# Patient Record
Sex: Female | Born: 1945 | Race: White | Hispanic: No | Marital: Married | Smoking: Never smoker
Health system: Southern US, Community
[De-identification: ages and names within clinical notes are randomized; demographics above are authoritative.]

## PROBLEM LIST (undated history)

## (undated) DIAGNOSIS — W19XXXA Unspecified fall, initial encounter: Secondary | ICD-10-CM

## (undated) DIAGNOSIS — I5189 Other ill-defined heart diseases: Secondary | ICD-10-CM

## (undated) DIAGNOSIS — I10 Essential (primary) hypertension: Secondary | ICD-10-CM

## (undated) DIAGNOSIS — R55 Syncope and collapse: Secondary | ICD-10-CM

## (undated) DIAGNOSIS — R7302 Impaired glucose tolerance (oral): Secondary | ICD-10-CM

## (undated) HISTORY — PX: CHOLECYSTECTOMY: SHX55

## (undated) HISTORY — DX: Impaired glucose tolerance (oral): R73.02

## (undated) HISTORY — DX: Unspecified fall, initial encounter: W19.XXXA

## (undated) HISTORY — DX: Other ill-defined heart diseases: I51.89

## (undated) HISTORY — PX: TOTAL ABDOMINAL HYSTERECTOMY: SHX209

## (undated) HISTORY — DX: Essential (primary) hypertension: I10

## (undated) HISTORY — PX: APPENDECTOMY: SHX54

## (undated) HISTORY — DX: Syncope and collapse: R55

## (undated) HISTORY — DX: Morbid (severe) obesity due to excess calories: E66.01

---

## 2001-03-05 HISTORY — PX: BREAST EXCISIONAL BIOPSY: SUR124

## 2010-02-01 ENCOUNTER — Encounter (INDEPENDENT_AMBULATORY_CARE_PROVIDER_SITE_OTHER): Payer: Self-pay | Admitting: Internal Medicine

## 2010-02-01 ENCOUNTER — Observation Stay (HOSPITAL_COMMUNITY)
Admission: EM | Admit: 2010-02-01 | Discharge: 2010-02-03 | Payer: Self-pay | Source: Home / Self Care | Admitting: Emergency Medicine

## 2010-02-01 ENCOUNTER — Ambulatory Visit: Payer: Self-pay | Admitting: Cardiology

## 2010-02-02 ENCOUNTER — Encounter (INDEPENDENT_AMBULATORY_CARE_PROVIDER_SITE_OTHER): Payer: Self-pay | Admitting: Internal Medicine

## 2010-02-13 ENCOUNTER — Ambulatory Visit: Payer: Self-pay | Admitting: Cardiology

## 2010-02-28 ENCOUNTER — Telehealth (INDEPENDENT_AMBULATORY_CARE_PROVIDER_SITE_OTHER): Payer: Self-pay | Admitting: *Deleted

## 2010-03-01 ENCOUNTER — Encounter (HOSPITAL_COMMUNITY)
Admission: RE | Admit: 2010-03-01 | Discharge: 2010-04-04 | Payer: Self-pay | Source: Home / Self Care | Attending: Cardiology | Admitting: Cardiology

## 2010-03-01 ENCOUNTER — Encounter: Payer: Self-pay | Admitting: Cardiology

## 2010-03-01 ENCOUNTER — Ambulatory Visit: Payer: Self-pay

## 2010-03-31 ENCOUNTER — Ambulatory Visit: Payer: Self-pay | Admitting: Cardiology

## 2010-04-06 NOTE — Assessment & Plan Note (Signed)
Summary: Cardiology Nuclear Testing  Nuclear Med Background Indications for Stress Test: Evaluation for Ischemia, Post Hospital  Indications Comments: 02/01/10 [facility] for syncope and neg enzymes  History: Echo  History Comments: 02/02/10 Echo EF-NL  Symptoms: DOE, Syncope    Nuclear Pre-Procedure Cardiac Risk Factors: Family History - CAD, Hypertension, NIDDM Caffeine/Decaff Intake: none NPO After: 7:00 PM Lungs: Clear IV 0.9% NS with Angio Cath: 20g     IV Site: R Hand IV Started by: Cathlyn Parsons, RN Chest Size (in) 38     Cup Size B     Height (in): 67 Weight (lb): 182 BMI: 28.61  Nuclear Med Study 1 or 2 day study:  1 day     Stress Test Type:  Treadmill/Lexiscan Reading MD:  Olga Millers, MD     Referring MD:  Roger Shelter Resting Radionuclide:  Technetium 27m Tetrofosmin     Resting Radionuclide Dose:  11.0 mCi  Stress Radionuclide:  Technetium 42m Tetrofosmin     Stress Radionuclide Dose:  33.0 mCi   Stress Protocol      Max HR:  116 bpm     Predicted Max HR:  154 bpm  Max Systolic BP: 163 mm Hg     Percent Max HR:  75.32 %Rate Pressure Product:  09811  Lexiscan: 0.4 mg   Stress Test Technologist:  Irean Hong,  RN     Nuclear Technologist:  Harlow Asa, CNMT  Rest Procedure  Myocardial perfusion imaging was performed at rest 45 minutes following the intravenous administration of Technetium 63m Tetrofosmin.  Stress Procedure  The patient received IV Lexiscan 0.4 mg over 15-seconds with concurrent low level exercise and then Technetium 56m Tetrofosmin was injected at 30-seconds while the patient continued walking one more minute.  There were T wave changes with lexiscan. Quantitative spect images were obtained after a 45 minute delay.  QPS Raw Data Images:  Acquisition technically good; normal left ventricular size. Stress Images:  There is decreased uptake in the anterior wall. Rest Images:  There is decreased uptake in the anterior  wall. Subtraction (SDS):  No evidence of ischemia. Transient Ischemic Dilatation:  1.06  (Normal <1.22)  Lung/Heart Ratio:  .27  (Normal <0.45)  Quantitative Gated Spect Images QGS EDV:  81 ml QGS ESV:  24 ml QGS EF:  70 % QGS cine images:  Normal wall motion.   Overall Impression  Exercise Capacity: Lexiscan with no exercise. BP Response: Normal blood pressure response. Clinical Symptoms: There is chest pain ECG Impression: No significant ST segment change suggestive of ischemia. Overall Impression: Normal lexiscan nuclear study with anterior soft tissue attenuation but no ischemia.  Appended Document: Cardiology Nuclear Testing COPY SENT TO DR. Deborah Chalk

## 2010-04-06 NOTE — Progress Notes (Signed)
Summary: nuc pre procedure  Phone Note Outgoing Call Call back at Home Phone [phone]   Call placed by: Cathlyn Parsons RN,  February 28, 2010 5:21 PM Call placed to: Patient Reason for Call: Confirm/change Appt Summary of Call: Reviewed information on [employer] Information Sheet (see scanned document for further details).  Spoke with patient.      Nuclear Med Background Indications for Stress Test: Evaluation for Ischemia  Indications Comments: 02/01/10 [facility] for syncope and neg enzymes  History: Echo  History Comments: 02/02/10 Echo EF-nl  Symptoms: Syncope    Nuclear Pre-Procedure Cardiac Risk Factors: Family History - CAD, Hypertension, NIDDM

## 2010-05-16 LAB — CBC
HCT: 36 % (ref 36.0–46.0)
Platelets: 178 10*3/uL (ref 150–400)
RDW: 14.3 % (ref 11.5–15.5)
WBC: 4.9 10*3/uL (ref 4.0–10.5)

## 2010-05-16 LAB — CARDIAC PANEL(CRET KIN+CKTOT+MB+TROPI)
CK, MB: 1.1 ng/mL (ref 0.3–4.0)
Relative Index: INVALID (ref 0.0–2.5)
Troponin I: <0.01 ng/mL (ref 0.00–0.06)

## 2010-05-16 LAB — GLUCOSE, CAPILLARY

## 2010-05-16 LAB — BASIC METABOLIC PANEL
BUN: 10 mg/dL (ref 6–23)
Chloride: 111 mEq/L (ref 96–112)
Potassium: 4.1 mEq/L (ref 3.5–5.1)

## 2010-05-17 LAB — POCT I-STAT, CHEM 8
BUN: 17 mg/dL (ref 6–23)
Calcium, Ion: 1.22 mmol/L (ref 1.12–1.32)
Chloride: 109 mEq/L (ref 96–112)
Glucose, Bld: 114 mg/dL — ABNORMAL HIGH (ref 70–99)

## 2010-05-17 LAB — CBC
HCT: 39.5 % (ref 36.0–46.0)
Hemoglobin: 13.5 g/dL (ref 12.0–15.0)
RBC: 4.61 MIL/uL (ref 3.87–5.11)
WBC: 4.2 10*3/uL (ref 4.0–10.5)

## 2010-05-17 LAB — DIFFERENTIAL
Lymphocytes Relative: 29 % (ref 12–46)
Monocytes Absolute: 0.4 10*3/uL (ref 0.1–1.0)
Monocytes Relative: 9 % (ref 3–12)
Neutro Abs: 2.4 10*3/uL (ref 1.7–7.7)

## 2010-05-17 LAB — HEMOGLOBIN A1C: Mean Plasma Glucose: 131 mg/dL — ABNORMAL HIGH (ref <117)

## 2010-05-17 LAB — TSH: TSH: 1.466 u[IU]/mL (ref 0.350–4.500)

## 2010-05-17 LAB — URINALYSIS, ROUTINE W REFLEX MICROSCOPIC
Glucose, UA: NEGATIVE mg/dL
Ketones, ur: NEGATIVE mg/dL
Specific Gravity, Urine: 1.019 (ref 1.005–1.030)
pH: 7 (ref 5.0–8.0)

## 2010-05-17 LAB — POCT CARDIAC MARKERS
CKMB, poc: <1 ng/mL — ABNORMAL LOW (ref 1.0–8.0)
Myoglobin, poc: 67.9 ng/mL (ref 12–200)
Troponin i, poc: <0.05 ng/mL (ref 0.00–0.09)

## 2010-05-17 LAB — CARDIAC PANEL(CRET KIN+CKTOT+MB+TROPI)
Relative Index: INVALID (ref 0.0–2.5)
Total CK: 76 U/L (ref 7–177)
Troponin I: <0.01 ng/mL (ref 0.00–0.06)

## 2010-05-17 LAB — CK TOTAL AND CKMB (NOT AT ARMC): CK, MB: 1.4 ng/mL (ref 0.3–4.0)

## 2010-11-20 ENCOUNTER — Other Ambulatory Visit: Payer: Self-pay | Admitting: Cardiology

## 2010-11-20 NOTE — Telephone Encounter (Signed)
Refilled amlodipine 

## 2011-02-14 ENCOUNTER — Other Ambulatory Visit: Payer: Self-pay | Admitting: Cardiology

## 2011-03-21 DIAGNOSIS — H35369 Drusen (degenerative) of macula, unspecified eye: Secondary | ICD-10-CM | POA: Diagnosis not present

## 2011-03-21 DIAGNOSIS — H3581 Retinal edema: Secondary | ICD-10-CM | POA: Diagnosis not present

## 2011-05-11 DIAGNOSIS — I10 Essential (primary) hypertension: Secondary | ICD-10-CM | POA: Diagnosis not present

## 2011-05-11 DIAGNOSIS — Z01818 Encounter for other preprocedural examination: Secondary | ICD-10-CM | POA: Diagnosis not present

## 2011-05-23 DIAGNOSIS — H3581 Retinal edema: Secondary | ICD-10-CM | POA: Diagnosis not present

## 2011-05-23 DIAGNOSIS — H35379 Puckering of macula, unspecified eye: Secondary | ICD-10-CM | POA: Diagnosis not present

## 2011-05-23 DIAGNOSIS — I10 Essential (primary) hypertension: Secondary | ICD-10-CM | POA: Diagnosis not present

## 2011-06-20 DIAGNOSIS — K219 Gastro-esophageal reflux disease without esophagitis: Secondary | ICD-10-CM | POA: Diagnosis not present

## 2011-06-20 DIAGNOSIS — I10 Essential (primary) hypertension: Secondary | ICD-10-CM | POA: Diagnosis not present

## 2011-06-22 DIAGNOSIS — Z131 Encounter for screening for diabetes mellitus: Secondary | ICD-10-CM | POA: Diagnosis not present

## 2011-06-22 DIAGNOSIS — I10 Essential (primary) hypertension: Secondary | ICD-10-CM | POA: Diagnosis not present

## 2011-06-22 DIAGNOSIS — K219 Gastro-esophageal reflux disease without esophagitis: Secondary | ICD-10-CM | POA: Diagnosis not present

## 2011-06-29 DIAGNOSIS — H35379 Puckering of macula, unspecified eye: Secondary | ICD-10-CM | POA: Diagnosis not present

## 2011-07-12 DIAGNOSIS — H35379 Puckering of macula, unspecified eye: Secondary | ICD-10-CM | POA: Diagnosis not present

## 2011-08-13 ENCOUNTER — Other Ambulatory Visit: Payer: Self-pay | Admitting: *Deleted

## 2011-08-13 MED ORDER — AMLODIPINE BESYLATE 5 MG PO TABS: 5.0000 mg | ORAL_TABLET | Freq: Every day | ORAL | Status: AC

## 2011-08-22 DIAGNOSIS — H3581 Retinal edema: Secondary | ICD-10-CM | POA: Diagnosis not present

## 2011-08-22 DIAGNOSIS — H35379 Puckering of macula, unspecified eye: Secondary | ICD-10-CM | POA: Diagnosis not present

## 2011-08-31 DIAGNOSIS — H3581 Retinal edema: Secondary | ICD-10-CM | POA: Diagnosis not present

## 2011-08-31 DIAGNOSIS — H35379 Puckering of macula, unspecified eye: Secondary | ICD-10-CM | POA: Diagnosis not present

## 2011-09-20 DIAGNOSIS — H3581 Retinal edema: Secondary | ICD-10-CM | POA: Diagnosis not present

## 2011-09-20 DIAGNOSIS — H35379 Puckering of macula, unspecified eye: Secondary | ICD-10-CM | POA: Diagnosis not present

## 2011-12-03 DIAGNOSIS — Z23 Encounter for immunization: Secondary | ICD-10-CM | POA: Diagnosis not present

## 2011-12-13 DIAGNOSIS — H35379 Puckering of macula, unspecified eye: Secondary | ICD-10-CM | POA: Diagnosis not present

## 2011-12-20 DIAGNOSIS — H35379 Puckering of macula, unspecified eye: Secondary | ICD-10-CM | POA: Diagnosis not present

## 2011-12-20 DIAGNOSIS — H3581 Retinal edema: Secondary | ICD-10-CM | POA: Diagnosis not present

## 2012-02-21 ENCOUNTER — Other Ambulatory Visit: Payer: Self-pay | Admitting: Family Medicine

## 2012-02-21 DIAGNOSIS — Z1231 Encounter for screening mammogram for malignant neoplasm of breast: Secondary | ICD-10-CM

## 2012-02-21 DIAGNOSIS — Z0000 Encounter for general adult medical examination without abnormal findings: Secondary | ICD-10-CM | POA: Diagnosis not present

## 2012-02-21 DIAGNOSIS — Z780 Asymptomatic menopausal state: Secondary | ICD-10-CM

## 2012-02-29 DIAGNOSIS — Z23 Encounter for immunization: Secondary | ICD-10-CM | POA: Diagnosis not present

## 2012-03-27 ENCOUNTER — Ambulatory Visit
Admission: RE | Admit: 2012-03-27 | Discharge: 2012-03-27 | Disposition: A | Discharge status: Home / Self Care | Payer: Medicare Other | Source: Ambulatory Visit | Attending: Family Medicine | Admitting: Family Medicine

## 2012-03-27 DIAGNOSIS — Z13820 Encounter for screening for osteoporosis: Secondary | ICD-10-CM | POA: Diagnosis not present

## 2012-03-27 DIAGNOSIS — Z1231 Encounter for screening mammogram for malignant neoplasm of breast: Secondary | ICD-10-CM | POA: Diagnosis not present

## 2012-03-27 DIAGNOSIS — Z780 Asymptomatic menopausal state: Secondary | ICD-10-CM

## 2012-04-09 DIAGNOSIS — D235 Other benign neoplasm of skin of trunk: Secondary | ICD-10-CM | POA: Diagnosis not present

## 2012-04-09 DIAGNOSIS — B009 Herpesviral infection, unspecified: Secondary | ICD-10-CM | POA: Diagnosis not present

## 2012-04-09 DIAGNOSIS — L820 Inflamed seborrheic keratosis: Secondary | ICD-10-CM | POA: Diagnosis not present

## 2012-04-30 ENCOUNTER — Encounter: Payer: Self-pay | Admitting: Cardiology

## 2012-06-23 DIAGNOSIS — K219 Gastro-esophageal reflux disease without esophagitis: Secondary | ICD-10-CM | POA: Diagnosis not present

## 2012-06-23 DIAGNOSIS — I10 Essential (primary) hypertension: Secondary | ICD-10-CM | POA: Diagnosis not present

## 2012-06-23 DIAGNOSIS — K5900 Constipation, unspecified: Secondary | ICD-10-CM | POA: Diagnosis not present

## 2012-07-21 DIAGNOSIS — H35379 Puckering of macula, unspecified eye: Secondary | ICD-10-CM | POA: Diagnosis not present

## 2012-07-23 DIAGNOSIS — H35379 Puckering of macula, unspecified eye: Secondary | ICD-10-CM | POA: Diagnosis not present

## 2012-07-23 DIAGNOSIS — H3581 Retinal edema: Secondary | ICD-10-CM | POA: Diagnosis not present

## 2012-08-20 DIAGNOSIS — H02829 Cysts of unspecified eye, unspecified eyelid: Secondary | ICD-10-CM | POA: Diagnosis not present

## 2012-09-24 DIAGNOSIS — H02829 Cysts of unspecified eye, unspecified eyelid: Secondary | ICD-10-CM | POA: Diagnosis not present

## 2013-01-05 DIAGNOSIS — Z23 Encounter for immunization: Secondary | ICD-10-CM | POA: Diagnosis not present

## 2013-03-09 ENCOUNTER — Other Ambulatory Visit: Payer: Self-pay

## 2013-03-09 DIAGNOSIS — Z1231 Encounter for screening mammogram for malignant neoplasm of breast: Secondary | ICD-10-CM

## 2013-03-11 DIAGNOSIS — Z0000 Encounter for general adult medical examination without abnormal findings: Secondary | ICD-10-CM | POA: Diagnosis not present

## 2013-03-11 DIAGNOSIS — Z136 Encounter for screening for cardiovascular disorders: Secondary | ICD-10-CM | POA: Diagnosis not present

## 2013-03-11 DIAGNOSIS — Z131 Encounter for screening for diabetes mellitus: Secondary | ICD-10-CM | POA: Diagnosis not present

## 2013-03-11 DIAGNOSIS — R7309 Other abnormal glucose: Secondary | ICD-10-CM | POA: Diagnosis not present

## 2013-03-30 ENCOUNTER — Ambulatory Visit
Admission: RE | Admit: 2013-03-30 | Discharge: 2013-03-30 | Disposition: A | Discharge status: Home / Self Care | Payer: Medicare Other | Source: Ambulatory Visit

## 2013-03-30 DIAGNOSIS — Z1231 Encounter for screening mammogram for malignant neoplasm of breast: Secondary | ICD-10-CM | POA: Diagnosis not present

## 2013-07-28 DIAGNOSIS — H40059 Ocular hypertension, unspecified eye: Secondary | ICD-10-CM | POA: Diagnosis not present

## 2013-07-28 DIAGNOSIS — H26499 Other secondary cataract, unspecified eye: Secondary | ICD-10-CM | POA: Diagnosis not present

## 2013-07-29 DIAGNOSIS — H3581 Retinal edema: Secondary | ICD-10-CM | POA: Diagnosis not present

## 2013-07-29 DIAGNOSIS — H35349 Macular cyst, hole, or pseudohole, unspecified eye: Secondary | ICD-10-CM | POA: Diagnosis not present

## 2013-10-01 DIAGNOSIS — H35369 Drusen (degenerative) of macula, unspecified eye: Secondary | ICD-10-CM | POA: Diagnosis not present

## 2013-10-01 DIAGNOSIS — H353190 Nonexudative age-related macular degeneration, unspecified eye, stage unspecified: Secondary | ICD-10-CM | POA: Diagnosis not present

## 2013-10-01 DIAGNOSIS — H3581 Retinal edema: Secondary | ICD-10-CM | POA: Diagnosis not present

## 2013-10-01 DIAGNOSIS — H35379 Puckering of macula, unspecified eye: Secondary | ICD-10-CM | POA: Diagnosis not present

## 2013-10-01 DIAGNOSIS — H35349 Macular cyst, hole, or pseudohole, unspecified eye: Secondary | ICD-10-CM | POA: Diagnosis not present

## 2013-11-25 DIAGNOSIS — Z23 Encounter for immunization: Secondary | ICD-10-CM | POA: Diagnosis not present

## 2013-12-17 DIAGNOSIS — H35341 Macular cyst, hole, or pseudohole, right eye: Secondary | ICD-10-CM | POA: Diagnosis not present

## 2013-12-17 DIAGNOSIS — H35372 Puckering of macula, left eye: Secondary | ICD-10-CM | POA: Diagnosis not present

## 2013-12-17 DIAGNOSIS — H3531 Nonexudative age-related macular degeneration: Secondary | ICD-10-CM | POA: Diagnosis not present

## 2014-01-04 ENCOUNTER — Ambulatory Visit (INDEPENDENT_AMBULATORY_CARE_PROVIDER_SITE_OTHER): Payer: Medicare Other | Admitting: Podiatry

## 2014-01-04 ENCOUNTER — Encounter: Payer: Self-pay | Admitting: Podiatry

## 2014-01-04 VITALS — BP 150/73 | HR 80 | Resp 13 | Ht 66.0 in | Wt 190.0 lb

## 2014-01-04 DIAGNOSIS — M79673 Pain in unspecified foot: Secondary | ICD-10-CM

## 2014-01-04 DIAGNOSIS — B070 Plantar wart: Secondary | ICD-10-CM

## 2014-01-04 NOTE — Progress Notes (Signed)
   Subjective:    Patient ID: Sarah Frye, female    DOB: Jun 20, 1945, 68 y.o.   MRN: [id number]  HPI Comments: N callouses vs plantar warts L B/L medial heels D right - 3 weeks     Left - couple months C hard skin with darkened center A walking or pressure T filing areas down  These recently developed skin lesions on the medial borders of the right and left heels are extremely painful and reduces the amount of standing walking. There is no prior history of these lesions.  Review of Systems  HENT: Positive for tinnitus.   Eyes: Positive for visual disturbance.  All other systems reviewed and are negative.      Objective:   Physical Exam  Orientated 3  Vascular: DP pulses 2/4 bilaterally PT pulses 2/4 bilaterally Capillary reflex immediate bilaterally  Neurological: Sensation to 10 g monofilament wire 5/5 bilaterally Ankle reflex equal and reactive bilaterally  Dermatological: The medial right and left heels have 5 mm circumscribed hemorrhagic keratoses that are exquisitely tender to pressure. These 2 lesions duplicated areas of patient's discomfort. Debridement of these lesions reveals pinpoint bleeding.  Musculoskeletal: No deformities noted      Assessment & Plan:   Assessment: Satisfactory neurovascular status bilaterally  verrucae plantaris 2 :(1 lesion right heel and 1 lesion left heel)  Plan: Advised patient that these lesions seem to be most consistent with plantar warts and discuss treatment options including self treatment of topical salicylic acid, office chemotherapy or curettage. Patient is opting for surgical curettage.  The right and left heels were blocked with 60 mg of Xylocaine with epinephrine 1-100,000 in divided doses. Skin is prepped with Betadine  The right and left skin lesions were sharply circumscribed and curetted. The bases were painted with phenol and antibiotic compression dressings applied.  Patient tolerated procedures without  any difficulty This lesions were submitted for biopsy in separate bottles labeled right and labeled left Notify patient of pathology reports  Postoperative oral written instructions provided Reappoint at patient's request

## 2014-01-04 NOTE — Patient Instructions (Signed)
Use over-the-counter pain medication for pain control  ANTIBACTERIAL SOAP INSTRUCTIONS  THE DAY AFTER PROCEDURE  Please follow the instructions your doctor has marked.   Shower as usual. Before getting out, place a drop of antibacterial liquid soap (Dial) on a wet, clean washcloth.  Gently wipe washcloth over affected area.  Afterward, rinse the area with warm water.  Blot the area dry with a soft cloth and cover with antibiotic ointment (neosporin, polysporin, bacitracin) and band aid or gauze and tape  Place 3-4 drops of antibacterial liquid soap in a quart of warm tap water.  Submerge foot into water for 20 minutes.  If bandage was applied after your procedure, leave on to allow for easy lift off, then remove and continue with soak for the remaining time.  Next, blot area dry with a soft cloth and cover with a bandage.  Apply other medications as directed by your doctor, such as cortisporin otic solution (eardrops) or neosporin antibiotic ointment

## 2014-01-05 ENCOUNTER — Telehealth: Payer: Self-pay | Admitting: *Deleted

## 2014-01-05 NOTE — Telephone Encounter (Signed)
Skin wedge B/L foot sent to [facility] to rule out Verruca via [employer].

## 2014-03-03 ENCOUNTER — Other Ambulatory Visit: Payer: Self-pay

## 2014-03-03 DIAGNOSIS — Z1231 Encounter for screening mammogram for malignant neoplasm of breast: Secondary | ICD-10-CM

## 2014-03-12 DIAGNOSIS — I10 Essential (primary) hypertension: Secondary | ICD-10-CM | POA: Diagnosis not present

## 2014-03-12 DIAGNOSIS — Z0000 Encounter for general adult medical examination without abnormal findings: Secondary | ICD-10-CM | POA: Diagnosis not present

## 2014-03-12 DIAGNOSIS — K219 Gastro-esophageal reflux disease without esophagitis: Secondary | ICD-10-CM | POA: Diagnosis not present

## 2014-03-12 DIAGNOSIS — R7309 Other abnormal glucose: Secondary | ICD-10-CM | POA: Diagnosis not present

## 2014-03-12 DIAGNOSIS — Z23 Encounter for immunization: Secondary | ICD-10-CM | POA: Diagnosis not present

## 2014-04-01 ENCOUNTER — Ambulatory Visit
Admission: RE | Admit: 2014-04-01 | Discharge: 2014-04-01 | Disposition: A | Discharge status: Home / Self Care | Payer: Medicare Other | Source: Ambulatory Visit

## 2014-04-01 DIAGNOSIS — Z1231 Encounter for screening mammogram for malignant neoplasm of breast: Secondary | ICD-10-CM

## 2014-04-05 DIAGNOSIS — Z86010 Personal history of colonic polyps: Secondary | ICD-10-CM | POA: Diagnosis not present

## 2014-04-05 DIAGNOSIS — K219 Gastro-esophageal reflux disease without esophagitis: Secondary | ICD-10-CM | POA: Diagnosis not present

## 2014-04-13 DIAGNOSIS — D485 Neoplasm of uncertain behavior of skin: Secondary | ICD-10-CM | POA: Diagnosis not present

## 2014-04-13 DIAGNOSIS — Z1283 Encounter for screening for malignant neoplasm of skin: Secondary | ICD-10-CM | POA: Diagnosis not present

## 2014-04-13 DIAGNOSIS — D225 Melanocytic nevi of trunk: Secondary | ICD-10-CM | POA: Diagnosis not present

## 2014-05-03 DIAGNOSIS — K2950 Unspecified chronic gastritis without bleeding: Secondary | ICD-10-CM | POA: Diagnosis not present

## 2014-05-03 DIAGNOSIS — K5730 Diverticulosis of large intestine without perforation or abscess without bleeding: Secondary | ICD-10-CM | POA: Diagnosis not present

## 2014-05-03 DIAGNOSIS — K449 Diaphragmatic hernia without obstruction or gangrene: Secondary | ICD-10-CM | POA: Diagnosis not present

## 2014-05-03 DIAGNOSIS — K219 Gastro-esophageal reflux disease without esophagitis: Secondary | ICD-10-CM | POA: Diagnosis not present

## 2014-05-03 DIAGNOSIS — Z09 Encounter for follow-up examination after completed treatment for conditions other than malignant neoplasm: Secondary | ICD-10-CM | POA: Diagnosis not present

## 2014-05-03 DIAGNOSIS — Z1211 Encounter for screening for malignant neoplasm of colon: Secondary | ICD-10-CM | POA: Diagnosis not present

## 2014-05-03 DIAGNOSIS — K317 Polyp of stomach and duodenum: Secondary | ICD-10-CM | POA: Diagnosis not present

## 2014-05-03 DIAGNOSIS — Z86010 Personal history of colonic polyps: Secondary | ICD-10-CM | POA: Diagnosis not present

## 2014-05-03 DIAGNOSIS — Z13810 Encounter for screening for upper gastrointestinal disorder: Secondary | ICD-10-CM | POA: Diagnosis not present

## 2014-05-10 DIAGNOSIS — H35341 Macular cyst, hole, or pseudohole, right eye: Secondary | ICD-10-CM | POA: Diagnosis not present

## 2014-06-02 DIAGNOSIS — M542 Cervicalgia: Secondary | ICD-10-CM | POA: Diagnosis not present

## 2014-07-28 DIAGNOSIS — E119 Type 2 diabetes mellitus without complications: Secondary | ICD-10-CM | POA: Diagnosis not present

## 2014-07-28 DIAGNOSIS — Z86010 Personal history of colonic polyps: Secondary | ICD-10-CM | POA: Diagnosis not present

## 2014-07-28 DIAGNOSIS — Z5181 Encounter for therapeutic drug level monitoring: Secondary | ICD-10-CM | POA: Diagnosis not present

## 2014-07-28 DIAGNOSIS — K219 Gastro-esophageal reflux disease without esophagitis: Secondary | ICD-10-CM | POA: Diagnosis not present

## 2014-08-09 DIAGNOSIS — I10 Essential (primary) hypertension: Secondary | ICD-10-CM | POA: Diagnosis not present

## 2014-08-09 DIAGNOSIS — H35341 Macular cyst, hole, or pseudohole, right eye: Secondary | ICD-10-CM | POA: Diagnosis not present

## 2014-08-09 DIAGNOSIS — H35371 Puckering of macula, right eye: Secondary | ICD-10-CM | POA: Diagnosis not present

## 2014-08-24 DIAGNOSIS — H35341 Macular cyst, hole, or pseudohole, right eye: Secondary | ICD-10-CM | POA: Diagnosis not present

## 2014-08-24 DIAGNOSIS — H3531 Nonexudative age-related macular degeneration: Secondary | ICD-10-CM | POA: Diagnosis not present

## 2014-10-04 DIAGNOSIS — H40011 Open angle with borderline findings, low risk, right eye: Secondary | ICD-10-CM | POA: Diagnosis not present

## 2014-10-04 DIAGNOSIS — H40013 Open angle with borderline findings, low risk, bilateral: Secondary | ICD-10-CM | POA: Diagnosis not present

## 2014-10-04 DIAGNOSIS — H40012 Open angle with borderline findings, low risk, left eye: Secondary | ICD-10-CM | POA: Diagnosis not present

## 2014-10-04 DIAGNOSIS — H35371 Puckering of macula, right eye: Secondary | ICD-10-CM | POA: Diagnosis not present

## 2014-10-05 DIAGNOSIS — H3581 Retinal edema: Secondary | ICD-10-CM | POA: Diagnosis not present

## 2014-10-05 DIAGNOSIS — H34831 Tributary (branch) retinal vein occlusion, right eye: Secondary | ICD-10-CM | POA: Diagnosis not present

## 2014-10-14 DIAGNOSIS — J069 Acute upper respiratory infection, unspecified: Secondary | ICD-10-CM | POA: Diagnosis not present

## 2014-11-16 DIAGNOSIS — H3531 Nonexudative age-related macular degeneration: Secondary | ICD-10-CM | POA: Diagnosis not present

## 2014-11-16 DIAGNOSIS — H35373 Puckering of macula, bilateral: Secondary | ICD-10-CM | POA: Diagnosis not present

## 2014-11-16 DIAGNOSIS — H3581 Retinal edema: Secondary | ICD-10-CM | POA: Diagnosis not present

## 2014-11-16 DIAGNOSIS — H34831 Tributary (branch) retinal vein occlusion, right eye: Secondary | ICD-10-CM | POA: Diagnosis not present

## 2014-11-18 DIAGNOSIS — H26491 Other secondary cataract, right eye: Secondary | ICD-10-CM | POA: Diagnosis not present

## 2014-12-08 DIAGNOSIS — H26491 Other secondary cataract, right eye: Secondary | ICD-10-CM | POA: Diagnosis not present

## 2015-01-11 DIAGNOSIS — H353131 Nonexudative age-related macular degeneration, bilateral, early dry stage: Secondary | ICD-10-CM | POA: Diagnosis not present

## 2015-01-11 DIAGNOSIS — H348312 Tributary (branch) retinal vein occlusion, right eye, stable: Secondary | ICD-10-CM | POA: Diagnosis not present

## 2015-01-11 DIAGNOSIS — H35373 Puckering of macula, bilateral: Secondary | ICD-10-CM | POA: Diagnosis not present

## 2015-01-19 DIAGNOSIS — Z23 Encounter for immunization: Secondary | ICD-10-CM | POA: Diagnosis not present

## 2015-02-08 DIAGNOSIS — H2511 Age-related nuclear cataract, right eye: Secondary | ICD-10-CM | POA: Diagnosis not present

## 2015-02-08 DIAGNOSIS — H26491 Other secondary cataract, right eye: Secondary | ICD-10-CM | POA: Diagnosis not present

## 2015-03-09 ENCOUNTER — Other Ambulatory Visit: Payer: Self-pay

## 2015-03-09 DIAGNOSIS — Z1231 Encounter for screening mammogram for malignant neoplasm of breast: Secondary | ICD-10-CM

## 2015-03-29 DIAGNOSIS — Z1159 Encounter for screening for other viral diseases: Secondary | ICD-10-CM | POA: Diagnosis not present

## 2015-03-29 DIAGNOSIS — E7800 Pure hypercholesterolemia, unspecified: Secondary | ICD-10-CM | POA: Diagnosis not present

## 2015-03-29 DIAGNOSIS — Z86010 Personal history of colonic polyps: Secondary | ICD-10-CM | POA: Diagnosis not present

## 2015-03-29 DIAGNOSIS — R7309 Other abnormal glucose: Secondary | ICD-10-CM | POA: Diagnosis not present

## 2015-03-29 DIAGNOSIS — K219 Gastro-esophageal reflux disease without esophagitis: Secondary | ICD-10-CM | POA: Diagnosis not present

## 2015-03-29 DIAGNOSIS — R7301 Impaired fasting glucose: Secondary | ICD-10-CM | POA: Diagnosis not present

## 2015-03-29 DIAGNOSIS — I10 Essential (primary) hypertension: Secondary | ICD-10-CM | POA: Diagnosis not present

## 2015-03-29 DIAGNOSIS — Z0000 Encounter for general adult medical examination without abnormal findings: Secondary | ICD-10-CM | POA: Diagnosis not present

## 2015-04-04 ENCOUNTER — Ambulatory Visit
Admission: RE | Admit: 2015-04-04 | Discharge: 2015-04-04 | Disposition: A | Discharge status: Home / Self Care | Payer: Medicare Other | Source: Ambulatory Visit

## 2015-04-04 DIAGNOSIS — Z1231 Encounter for screening mammogram for malignant neoplasm of breast: Secondary | ICD-10-CM | POA: Diagnosis not present

## 2015-05-03 DIAGNOSIS — Z86010 Personal history of colonic polyps: Secondary | ICD-10-CM | POA: Diagnosis not present

## 2015-05-03 DIAGNOSIS — K219 Gastro-esophageal reflux disease without esophagitis: Secondary | ICD-10-CM | POA: Diagnosis not present

## 2015-05-03 DIAGNOSIS — K317 Polyp of stomach and duodenum: Secondary | ICD-10-CM | POA: Diagnosis not present

## 2015-05-10 DIAGNOSIS — H353131 Nonexudative age-related macular degeneration, bilateral, early dry stage: Secondary | ICD-10-CM | POA: Diagnosis not present

## 2015-05-10 DIAGNOSIS — H35373 Puckering of macula, bilateral: Secondary | ICD-10-CM | POA: Diagnosis not present

## 2015-05-10 DIAGNOSIS — H34831 Tributary (branch) retinal vein occlusion, right eye, with macular edema: Secondary | ICD-10-CM | POA: Diagnosis not present

## 2015-05-10 DIAGNOSIS — H348312 Tributary (branch) retinal vein occlusion, right eye, stable: Secondary | ICD-10-CM | POA: Diagnosis not present

## 2015-05-13 DIAGNOSIS — H401131 Primary open-angle glaucoma, bilateral, mild stage: Secondary | ICD-10-CM | POA: Diagnosis not present

## 2015-05-13 DIAGNOSIS — H401121 Primary open-angle glaucoma, left eye, mild stage: Secondary | ICD-10-CM | POA: Diagnosis not present

## 2015-05-13 DIAGNOSIS — H43813 Vitreous degeneration, bilateral: Secondary | ICD-10-CM | POA: Diagnosis not present

## 2015-05-13 DIAGNOSIS — H35371 Puckering of macula, right eye: Secondary | ICD-10-CM | POA: Diagnosis not present

## 2015-05-13 DIAGNOSIS — H401111 Primary open-angle glaucoma, right eye, mild stage: Secondary | ICD-10-CM | POA: Diagnosis not present

## 2015-05-13 DIAGNOSIS — H35372 Puckering of macula, left eye: Secondary | ICD-10-CM | POA: Diagnosis not present

## 2015-05-13 DIAGNOSIS — H43812 Vitreous degeneration, left eye: Secondary | ICD-10-CM | POA: Diagnosis not present

## 2015-05-13 DIAGNOSIS — H43811 Vitreous degeneration, right eye: Secondary | ICD-10-CM | POA: Diagnosis not present

## 2015-06-14 DIAGNOSIS — H8309 Labyrinthitis, unspecified ear: Secondary | ICD-10-CM | POA: Diagnosis not present

## 2015-06-14 DIAGNOSIS — R42 Dizziness and giddiness: Secondary | ICD-10-CM | POA: Diagnosis not present

## 2015-08-09 DIAGNOSIS — H35373 Puckering of macula, bilateral: Secondary | ICD-10-CM | POA: Diagnosis not present

## 2015-08-09 DIAGNOSIS — H353131 Nonexudative age-related macular degeneration, bilateral, early dry stage: Secondary | ICD-10-CM | POA: Diagnosis not present

## 2015-08-24 DIAGNOSIS — H6692 Otitis media, unspecified, left ear: Secondary | ICD-10-CM | POA: Diagnosis not present

## 2015-09-02 DIAGNOSIS — H6980 Other specified disorders of Eustachian tube, unspecified ear: Secondary | ICD-10-CM | POA: Diagnosis not present

## 2015-09-02 DIAGNOSIS — J329 Chronic sinusitis, unspecified: Secondary | ICD-10-CM | POA: Diagnosis not present

## 2015-09-13 DIAGNOSIS — J302 Other seasonal allergic rhinitis: Secondary | ICD-10-CM | POA: Diagnosis not present

## 2015-10-14 DIAGNOSIS — Z961 Presence of intraocular lens: Secondary | ICD-10-CM | POA: Diagnosis not present

## 2015-10-14 DIAGNOSIS — H40011 Open angle with borderline findings, low risk, right eye: Secondary | ICD-10-CM | POA: Diagnosis not present

## 2015-10-14 DIAGNOSIS — H40012 Open angle with borderline findings, low risk, left eye: Secondary | ICD-10-CM | POA: Diagnosis not present

## 2015-11-10 DIAGNOSIS — H401112 Primary open-angle glaucoma, right eye, moderate stage: Secondary | ICD-10-CM | POA: Diagnosis not present

## 2015-11-10 DIAGNOSIS — H401122 Primary open-angle glaucoma, left eye, moderate stage: Secondary | ICD-10-CM | POA: Diagnosis not present

## 2015-12-14 DIAGNOSIS — Z23 Encounter for immunization: Secondary | ICD-10-CM | POA: Diagnosis not present

## 2015-12-22 DIAGNOSIS — H401112 Primary open-angle glaucoma, right eye, moderate stage: Secondary | ICD-10-CM | POA: Diagnosis not present

## 2015-12-22 DIAGNOSIS — H401122 Primary open-angle glaucoma, left eye, moderate stage: Secondary | ICD-10-CM | POA: Diagnosis not present

## 2016-03-07 ENCOUNTER — Other Ambulatory Visit: Payer: Self-pay | Admitting: Family Medicine

## 2016-03-07 DIAGNOSIS — Z1231 Encounter for screening mammogram for malignant neoplasm of breast: Secondary | ICD-10-CM

## 2016-03-15 DIAGNOSIS — M25562 Pain in left knee: Secondary | ICD-10-CM | POA: Diagnosis not present

## 2016-03-23 DIAGNOSIS — H35341 Macular cyst, hole, or pseudohole, right eye: Secondary | ICD-10-CM | POA: Diagnosis not present

## 2016-04-02 DIAGNOSIS — M25562 Pain in left knee: Secondary | ICD-10-CM | POA: Diagnosis not present

## 2016-04-04 ENCOUNTER — Ambulatory Visit: Payer: Medicare Other

## 2016-04-10 DIAGNOSIS — Z0000 Encounter for general adult medical examination without abnormal findings: Secondary | ICD-10-CM | POA: Diagnosis not present

## 2016-04-10 DIAGNOSIS — I10 Essential (primary) hypertension: Secondary | ICD-10-CM | POA: Diagnosis not present

## 2016-04-10 DIAGNOSIS — E119 Type 2 diabetes mellitus without complications: Secondary | ICD-10-CM | POA: Diagnosis not present

## 2016-04-10 DIAGNOSIS — E7800 Pure hypercholesterolemia, unspecified: Secondary | ICD-10-CM | POA: Diagnosis not present

## 2016-04-10 DIAGNOSIS — K317 Polyp of stomach and duodenum: Secondary | ICD-10-CM | POA: Diagnosis not present

## 2016-04-10 DIAGNOSIS — Z86010 Personal history of colonic polyps: Secondary | ICD-10-CM | POA: Diagnosis not present

## 2016-04-16 DIAGNOSIS — H31011 Macula scars of posterior pole (postinflammatory) (post-traumatic), right eye: Secondary | ICD-10-CM | POA: Diagnosis not present

## 2016-04-16 DIAGNOSIS — H35341 Macular cyst, hole, or pseudohole, right eye: Secondary | ICD-10-CM | POA: Diagnosis not present

## 2016-05-02 ENCOUNTER — Ambulatory Visit
Admission: RE | Admit: 2016-05-02 | Discharge: 2016-05-02 | Disposition: A | Discharge status: Home / Self Care | Payer: Medicare Other | Source: Ambulatory Visit | Attending: Family Medicine | Admitting: Family Medicine

## 2016-05-02 DIAGNOSIS — Z1231 Encounter for screening mammogram for malignant neoplasm of breast: Secondary | ICD-10-CM

## 2016-05-24 DIAGNOSIS — H401112 Primary open-angle glaucoma, right eye, moderate stage: Secondary | ICD-10-CM | POA: Diagnosis not present

## 2016-07-24 DIAGNOSIS — H401132 Primary open-angle glaucoma, bilateral, moderate stage: Secondary | ICD-10-CM | POA: Diagnosis not present

## 2016-10-11 DIAGNOSIS — H5712 Ocular pain, left eye: Secondary | ICD-10-CM | POA: Diagnosis not present

## 2016-10-30 DIAGNOSIS — H401132 Primary open-angle glaucoma, bilateral, moderate stage: Secondary | ICD-10-CM | POA: Diagnosis not present

## 2016-10-30 DIAGNOSIS — I10 Essential (primary) hypertension: Secondary | ICD-10-CM | POA: Diagnosis not present

## 2016-10-30 DIAGNOSIS — Z7984 Long term (current) use of oral hypoglycemic drugs: Secondary | ICD-10-CM | POA: Diagnosis not present

## 2016-10-30 DIAGNOSIS — E7800 Pure hypercholesterolemia, unspecified: Secondary | ICD-10-CM | POA: Diagnosis not present

## 2016-10-30 DIAGNOSIS — E119 Type 2 diabetes mellitus without complications: Secondary | ICD-10-CM | POA: Diagnosis not present

## 2016-11-16 DIAGNOSIS — H401113 Primary open-angle glaucoma, right eye, severe stage: Secondary | ICD-10-CM | POA: Diagnosis not present

## 2016-12-12 DIAGNOSIS — Z23 Encounter for immunization: Secondary | ICD-10-CM | POA: Diagnosis not present

## 2017-01-31 DIAGNOSIS — H26492 Other secondary cataract, left eye: Secondary | ICD-10-CM | POA: Diagnosis not present

## 2017-01-31 DIAGNOSIS — H401132 Primary open-angle glaucoma, bilateral, moderate stage: Secondary | ICD-10-CM | POA: Diagnosis not present

## 2017-02-05 DIAGNOSIS — E7800 Pure hypercholesterolemia, unspecified: Secondary | ICD-10-CM | POA: Diagnosis not present

## 2017-02-05 DIAGNOSIS — E119 Type 2 diabetes mellitus without complications: Secondary | ICD-10-CM | POA: Diagnosis not present

## 2017-02-05 DIAGNOSIS — H401132 Primary open-angle glaucoma, bilateral, moderate stage: Secondary | ICD-10-CM | POA: Diagnosis not present

## 2017-02-05 DIAGNOSIS — E1165 Type 2 diabetes mellitus with hyperglycemia: Secondary | ICD-10-CM | POA: Diagnosis not present

## 2017-02-05 DIAGNOSIS — Z7984 Long term (current) use of oral hypoglycemic drugs: Secondary | ICD-10-CM | POA: Diagnosis not present

## 2017-02-05 DIAGNOSIS — I10 Essential (primary) hypertension: Secondary | ICD-10-CM | POA: Diagnosis not present

## 2017-02-27 DIAGNOSIS — H26492 Other secondary cataract, left eye: Secondary | ICD-10-CM | POA: Diagnosis not present

## 2017-03-22 ENCOUNTER — Other Ambulatory Visit: Payer: Self-pay | Admitting: Family Medicine

## 2017-03-22 DIAGNOSIS — Z1231 Encounter for screening mammogram for malignant neoplasm of breast: Secondary | ICD-10-CM

## 2017-04-30 DIAGNOSIS — Z86010 Personal history of colonic polyps: Secondary | ICD-10-CM | POA: Diagnosis not present

## 2017-04-30 DIAGNOSIS — H401132 Primary open-angle glaucoma, bilateral, moderate stage: Secondary | ICD-10-CM | POA: Diagnosis not present

## 2017-04-30 DIAGNOSIS — K219 Gastro-esophageal reflux disease without esophagitis: Secondary | ICD-10-CM | POA: Diagnosis not present

## 2017-04-30 DIAGNOSIS — Z0000 Encounter for general adult medical examination without abnormal findings: Secondary | ICD-10-CM | POA: Diagnosis not present

## 2017-04-30 DIAGNOSIS — Z7984 Long term (current) use of oral hypoglycemic drugs: Secondary | ICD-10-CM | POA: Diagnosis not present

## 2017-04-30 DIAGNOSIS — I10 Essential (primary) hypertension: Secondary | ICD-10-CM | POA: Diagnosis not present

## 2017-04-30 DIAGNOSIS — E119 Type 2 diabetes mellitus without complications: Secondary | ICD-10-CM | POA: Diagnosis not present

## 2017-04-30 DIAGNOSIS — K317 Polyp of stomach and duodenum: Secondary | ICD-10-CM | POA: Diagnosis not present

## 2017-04-30 DIAGNOSIS — E7800 Pure hypercholesterolemia, unspecified: Secondary | ICD-10-CM | POA: Diagnosis not present

## 2017-04-30 DIAGNOSIS — Z79899 Other long term (current) drug therapy: Secondary | ICD-10-CM | POA: Diagnosis not present

## 2017-05-03 ENCOUNTER — Ambulatory Visit
Admission: RE | Admit: 2017-05-03 | Discharge: 2017-05-03 | Disposition: A | Discharge status: Home / Self Care | Payer: Medicare Other | Source: Ambulatory Visit | Attending: Family Medicine | Admitting: Family Medicine

## 2017-05-03 DIAGNOSIS — Z1231 Encounter for screening mammogram for malignant neoplasm of breast: Secondary | ICD-10-CM | POA: Diagnosis not present

## 2017-05-26 DIAGNOSIS — H1132 Conjunctival hemorrhage, left eye: Secondary | ICD-10-CM | POA: Diagnosis not present

## 2017-07-05 DIAGNOSIS — H401131 Primary open-angle glaucoma, bilateral, mild stage: Secondary | ICD-10-CM | POA: Diagnosis not present

## 2017-08-06 DIAGNOSIS — E119 Type 2 diabetes mellitus without complications: Secondary | ICD-10-CM | POA: Diagnosis not present

## 2017-08-06 DIAGNOSIS — Z86010 Personal history of colonic polyps: Secondary | ICD-10-CM | POA: Diagnosis not present

## 2017-08-06 DIAGNOSIS — I10 Essential (primary) hypertension: Secondary | ICD-10-CM | POA: Diagnosis not present

## 2017-08-06 DIAGNOSIS — E7800 Pure hypercholesterolemia, unspecified: Secondary | ICD-10-CM | POA: Diagnosis not present

## 2017-08-06 DIAGNOSIS — Z7984 Long term (current) use of oral hypoglycemic drugs: Secondary | ICD-10-CM | POA: Diagnosis not present

## 2017-08-06 DIAGNOSIS — Z79899 Other long term (current) drug therapy: Secondary | ICD-10-CM | POA: Diagnosis not present

## 2017-08-06 DIAGNOSIS — H401132 Primary open-angle glaucoma, bilateral, moderate stage: Secondary | ICD-10-CM | POA: Diagnosis not present

## 2017-08-06 DIAGNOSIS — E1165 Type 2 diabetes mellitus with hyperglycemia: Secondary | ICD-10-CM | POA: Diagnosis not present

## 2017-08-06 DIAGNOSIS — K219 Gastro-esophageal reflux disease without esophagitis: Secondary | ICD-10-CM | POA: Diagnosis not present

## 2017-08-06 DIAGNOSIS — K317 Polyp of stomach and duodenum: Secondary | ICD-10-CM | POA: Diagnosis not present

## 2017-09-06 DIAGNOSIS — M2391 Unspecified internal derangement of right knee: Secondary | ICD-10-CM | POA: Diagnosis not present

## 2017-09-06 DIAGNOSIS — M25561 Pain in right knee: Secondary | ICD-10-CM | POA: Diagnosis not present

## 2017-11-05 DIAGNOSIS — H401132 Primary open-angle glaucoma, bilateral, moderate stage: Secondary | ICD-10-CM | POA: Diagnosis not present

## 2017-11-05 DIAGNOSIS — E119 Type 2 diabetes mellitus without complications: Secondary | ICD-10-CM | POA: Diagnosis not present

## 2017-11-05 DIAGNOSIS — H524 Presbyopia: Secondary | ICD-10-CM | POA: Diagnosis not present

## 2017-11-07 DIAGNOSIS — L309 Dermatitis, unspecified: Secondary | ICD-10-CM | POA: Diagnosis not present

## 2017-11-15 DIAGNOSIS — L308 Other specified dermatitis: Secondary | ICD-10-CM | POA: Diagnosis not present

## 2017-11-26 DIAGNOSIS — L308 Other specified dermatitis: Secondary | ICD-10-CM | POA: Diagnosis not present

## 2017-12-09 DIAGNOSIS — M25561 Pain in right knee: Secondary | ICD-10-CM | POA: Diagnosis not present

## 2017-12-25 DIAGNOSIS — Z23 Encounter for immunization: Secondary | ICD-10-CM | POA: Diagnosis not present

## 2018-03-06 DIAGNOSIS — H401131 Primary open-angle glaucoma, bilateral, mild stage: Secondary | ICD-10-CM | POA: Diagnosis not present

## 2018-03-26 ENCOUNTER — Other Ambulatory Visit: Payer: Self-pay | Admitting: Family Medicine

## 2018-03-26 DIAGNOSIS — Z1231 Encounter for screening mammogram for malignant neoplasm of breast: Secondary | ICD-10-CM

## 2018-05-01 DIAGNOSIS — H401132 Primary open-angle glaucoma, bilateral, moderate stage: Secondary | ICD-10-CM | POA: Diagnosis not present

## 2018-05-01 DIAGNOSIS — Z1389 Encounter for screening for other disorder: Secondary | ICD-10-CM | POA: Diagnosis not present

## 2018-05-01 DIAGNOSIS — Z5181 Encounter for therapeutic drug level monitoring: Secondary | ICD-10-CM | POA: Diagnosis not present

## 2018-05-01 DIAGNOSIS — E119 Type 2 diabetes mellitus without complications: Secondary | ICD-10-CM | POA: Diagnosis not present

## 2018-05-01 DIAGNOSIS — K317 Polyp of stomach and duodenum: Secondary | ICD-10-CM | POA: Diagnosis not present

## 2018-05-01 DIAGNOSIS — I10 Essential (primary) hypertension: Secondary | ICD-10-CM | POA: Diagnosis not present

## 2018-05-01 DIAGNOSIS — Z86010 Personal history of colonic polyps: Secondary | ICD-10-CM | POA: Diagnosis not present

## 2018-05-01 DIAGNOSIS — Z0000 Encounter for general adult medical examination without abnormal findings: Secondary | ICD-10-CM | POA: Diagnosis not present

## 2018-05-01 DIAGNOSIS — K219 Gastro-esophageal reflux disease without esophagitis: Secondary | ICD-10-CM | POA: Diagnosis not present

## 2018-05-01 DIAGNOSIS — E7800 Pure hypercholesterolemia, unspecified: Secondary | ICD-10-CM | POA: Diagnosis not present

## 2018-05-05 ENCOUNTER — Ambulatory Visit
Admission: RE | Admit: 2018-05-05 | Discharge: 2018-05-05 | Disposition: A | Discharge status: Home / Self Care | Payer: Medicare Other | Source: Ambulatory Visit | Attending: Family Medicine | Admitting: Family Medicine

## 2018-05-05 DIAGNOSIS — Z1231 Encounter for screening mammogram for malignant neoplasm of breast: Secondary | ICD-10-CM | POA: Diagnosis not present

## 2018-06-02 DIAGNOSIS — H401132 Primary open-angle glaucoma, bilateral, moderate stage: Secondary | ICD-10-CM | POA: Diagnosis not present

## 2018-06-02 DIAGNOSIS — H10413 Chronic giant papillary conjunctivitis, bilateral: Secondary | ICD-10-CM | POA: Diagnosis not present

## 2018-09-04 DIAGNOSIS — H401132 Primary open-angle glaucoma, bilateral, moderate stage: Secondary | ICD-10-CM | POA: Diagnosis not present

## 2018-09-26 DIAGNOSIS — M2391 Unspecified internal derangement of right knee: Secondary | ICD-10-CM | POA: Diagnosis not present

## 2018-09-26 DIAGNOSIS — M25561 Pain in right knee: Secondary | ICD-10-CM | POA: Diagnosis not present

## 2018-09-26 DIAGNOSIS — M238X1 Other internal derangements of right knee: Secondary | ICD-10-CM | POA: Diagnosis not present

## 2018-11-03 DIAGNOSIS — K219 Gastro-esophageal reflux disease without esophagitis: Secondary | ICD-10-CM | POA: Diagnosis not present

## 2018-11-03 DIAGNOSIS — E7800 Pure hypercholesterolemia, unspecified: Secondary | ICD-10-CM | POA: Diagnosis not present

## 2018-11-03 DIAGNOSIS — H401132 Primary open-angle glaucoma, bilateral, moderate stage: Secondary | ICD-10-CM | POA: Diagnosis not present

## 2018-11-03 DIAGNOSIS — I10 Essential (primary) hypertension: Secondary | ICD-10-CM | POA: Diagnosis not present

## 2018-11-03 DIAGNOSIS — K317 Polyp of stomach and duodenum: Secondary | ICD-10-CM | POA: Diagnosis not present

## 2018-11-03 DIAGNOSIS — E119 Type 2 diabetes mellitus without complications: Secondary | ICD-10-CM | POA: Diagnosis not present

## 2018-11-03 DIAGNOSIS — Z86010 Personal history of colonic polyps: Secondary | ICD-10-CM | POA: Diagnosis not present

## 2018-11-03 DIAGNOSIS — Z5181 Encounter for therapeutic drug level monitoring: Secondary | ICD-10-CM | POA: Diagnosis not present

## 2018-12-05 DIAGNOSIS — H401132 Primary open-angle glaucoma, bilateral, moderate stage: Secondary | ICD-10-CM | POA: Diagnosis not present

## 2018-12-05 DIAGNOSIS — Z961 Presence of intraocular lens: Secondary | ICD-10-CM | POA: Diagnosis not present

## 2018-12-08 DIAGNOSIS — Z23 Encounter for immunization: Secondary | ICD-10-CM | POA: Diagnosis not present

## 2018-12-08 DIAGNOSIS — H401132 Primary open-angle glaucoma, bilateral, moderate stage: Secondary | ICD-10-CM | POA: Diagnosis not present

## 2018-12-08 DIAGNOSIS — E7800 Pure hypercholesterolemia, unspecified: Secondary | ICD-10-CM | POA: Diagnosis not present

## 2018-12-08 DIAGNOSIS — I10 Essential (primary) hypertension: Secondary | ICD-10-CM | POA: Diagnosis not present

## 2018-12-08 DIAGNOSIS — K219 Gastro-esophageal reflux disease without esophagitis: Secondary | ICD-10-CM | POA: Diagnosis not present

## 2018-12-08 DIAGNOSIS — E119 Type 2 diabetes mellitus without complications: Secondary | ICD-10-CM | POA: Diagnosis not present

## 2018-12-08 DIAGNOSIS — Z5181 Encounter for therapeutic drug level monitoring: Secondary | ICD-10-CM | POA: Diagnosis not present

## 2018-12-08 DIAGNOSIS — K317 Polyp of stomach and duodenum: Secondary | ICD-10-CM | POA: Diagnosis not present

## 2018-12-08 DIAGNOSIS — Z86010 Personal history of colonic polyps: Secondary | ICD-10-CM | POA: Diagnosis not present

## 2019-01-03 DIAGNOSIS — E7800 Pure hypercholesterolemia, unspecified: Secondary | ICD-10-CM | POA: Diagnosis not present

## 2019-01-03 DIAGNOSIS — E119 Type 2 diabetes mellitus without complications: Secondary | ICD-10-CM | POA: Diagnosis not present

## 2019-01-03 DIAGNOSIS — I10 Essential (primary) hypertension: Secondary | ICD-10-CM | POA: Diagnosis not present

## 2019-01-06 DIAGNOSIS — I10 Essential (primary) hypertension: Secondary | ICD-10-CM | POA: Diagnosis not present

## 2019-01-06 DIAGNOSIS — E119 Type 2 diabetes mellitus without complications: Secondary | ICD-10-CM | POA: Diagnosis not present

## 2019-01-06 DIAGNOSIS — E7800 Pure hypercholesterolemia, unspecified: Secondary | ICD-10-CM | POA: Diagnosis not present

## 2019-04-06 ENCOUNTER — Other Ambulatory Visit: Payer: Self-pay | Admitting: Family Medicine

## 2019-04-06 DIAGNOSIS — Z1231 Encounter for screening mammogram for malignant neoplasm of breast: Secondary | ICD-10-CM

## 2019-04-09 DIAGNOSIS — H401132 Primary open-angle glaucoma, bilateral, moderate stage: Secondary | ICD-10-CM | POA: Diagnosis not present

## 2019-04-23 DIAGNOSIS — I10 Essential (primary) hypertension: Secondary | ICD-10-CM | POA: Diagnosis not present

## 2019-04-23 DIAGNOSIS — E7800 Pure hypercholesterolemia, unspecified: Secondary | ICD-10-CM | POA: Diagnosis not present

## 2019-04-23 DIAGNOSIS — E119 Type 2 diabetes mellitus without complications: Secondary | ICD-10-CM | POA: Diagnosis not present

## 2019-05-13 ENCOUNTER — Ambulatory Visit: Payer: Medicare Other

## 2019-06-16 ENCOUNTER — Other Ambulatory Visit: Payer: Self-pay

## 2019-06-16 ENCOUNTER — Ambulatory Visit
Admission: RE | Admit: 2019-06-16 | Discharge: 2019-06-16 | Disposition: A | Discharge status: Home / Self Care | Payer: Medicare Other | Source: Ambulatory Visit | Attending: Family Medicine | Admitting: Family Medicine

## 2019-06-16 DIAGNOSIS — Z1231 Encounter for screening mammogram for malignant neoplasm of breast: Secondary | ICD-10-CM

## 2019-06-22 DIAGNOSIS — R1310 Dysphagia, unspecified: Secondary | ICD-10-CM | POA: Diagnosis not present

## 2019-06-22 DIAGNOSIS — K219 Gastro-esophageal reflux disease without esophagitis: Secondary | ICD-10-CM | POA: Diagnosis not present

## 2019-06-22 DIAGNOSIS — Z86010 Personal history of colonic polyps: Secondary | ICD-10-CM | POA: Diagnosis not present

## 2019-06-22 DIAGNOSIS — K317 Polyp of stomach and duodenum: Secondary | ICD-10-CM | POA: Diagnosis not present

## 2019-07-01 DIAGNOSIS — I10 Essential (primary) hypertension: Secondary | ICD-10-CM | POA: Diagnosis not present

## 2019-07-01 DIAGNOSIS — E119 Type 2 diabetes mellitus without complications: Secondary | ICD-10-CM | POA: Diagnosis not present

## 2019-07-01 DIAGNOSIS — E7800 Pure hypercholesterolemia, unspecified: Secondary | ICD-10-CM | POA: Diagnosis not present

## 2019-07-07 ENCOUNTER — Other Ambulatory Visit: Payer: Self-pay

## 2019-07-07 ENCOUNTER — Ambulatory Visit
Admission: RE | Admit: 2019-07-07 | Discharge: 2019-07-07 | Disposition: A | Discharge status: Home / Self Care | Payer: Medicare Other | Source: Ambulatory Visit | Attending: Family Medicine | Admitting: Family Medicine

## 2019-07-07 ENCOUNTER — Other Ambulatory Visit: Payer: Self-pay | Admitting: Family Medicine

## 2019-07-07 DIAGNOSIS — M79642 Pain in left hand: Secondary | ICD-10-CM | POA: Diagnosis not present

## 2019-07-07 DIAGNOSIS — M25561 Pain in right knee: Secondary | ICD-10-CM | POA: Diagnosis not present

## 2019-07-07 DIAGNOSIS — S0990XA Unspecified injury of head, initial encounter: Secondary | ICD-10-CM

## 2019-07-07 DIAGNOSIS — W19XXXA Unspecified fall, initial encounter: Secondary | ICD-10-CM | POA: Diagnosis not present

## 2019-07-20 DIAGNOSIS — Z1159 Encounter for screening for other viral diseases: Secondary | ICD-10-CM | POA: Diagnosis not present

## 2019-07-23 DIAGNOSIS — K317 Polyp of stomach and duodenum: Secondary | ICD-10-CM | POA: Diagnosis not present

## 2019-07-23 DIAGNOSIS — Z86010 Personal history of colonic polyps: Secondary | ICD-10-CM | POA: Diagnosis not present

## 2019-07-23 DIAGNOSIS — K5730 Diverticulosis of large intestine without perforation or abscess without bleeding: Secondary | ICD-10-CM | POA: Diagnosis not present

## 2019-07-23 DIAGNOSIS — R1310 Dysphagia, unspecified: Secondary | ICD-10-CM | POA: Diagnosis not present

## 2019-07-23 DIAGNOSIS — R12 Heartburn: Secondary | ICD-10-CM | POA: Diagnosis not present

## 2019-07-23 DIAGNOSIS — K449 Diaphragmatic hernia without obstruction or gangrene: Secondary | ICD-10-CM | POA: Diagnosis not present

## 2019-07-23 DIAGNOSIS — D123 Benign neoplasm of transverse colon: Secondary | ICD-10-CM | POA: Diagnosis not present

## 2019-07-28 DIAGNOSIS — K317 Polyp of stomach and duodenum: Secondary | ICD-10-CM | POA: Diagnosis not present

## 2019-07-28 DIAGNOSIS — D123 Benign neoplasm of transverse colon: Secondary | ICD-10-CM | POA: Diagnosis not present

## 2019-07-30 DIAGNOSIS — M25561 Pain in right knee: Secondary | ICD-10-CM | POA: Diagnosis not present

## 2019-07-30 DIAGNOSIS — S8001XA Contusion of right knee, initial encounter: Secondary | ICD-10-CM | POA: Diagnosis not present

## 2019-08-10 DIAGNOSIS — H401132 Primary open-angle glaucoma, bilateral, moderate stage: Secondary | ICD-10-CM | POA: Diagnosis not present

## 2019-10-24 DIAGNOSIS — E7800 Pure hypercholesterolemia, unspecified: Secondary | ICD-10-CM | POA: Diagnosis not present

## 2019-10-24 DIAGNOSIS — I10 Essential (primary) hypertension: Secondary | ICD-10-CM | POA: Diagnosis not present

## 2019-10-24 DIAGNOSIS — E119 Type 2 diabetes mellitus without complications: Secondary | ICD-10-CM | POA: Diagnosis not present

## 2019-11-20 DIAGNOSIS — E119 Type 2 diabetes mellitus without complications: Secondary | ICD-10-CM | POA: Diagnosis not present

## 2019-11-20 DIAGNOSIS — E7800 Pure hypercholesterolemia, unspecified: Secondary | ICD-10-CM | POA: Diagnosis not present

## 2019-11-20 DIAGNOSIS — I10 Essential (primary) hypertension: Secondary | ICD-10-CM | POA: Diagnosis not present

## 2019-12-14 ENCOUNTER — Other Ambulatory Visit: Payer: Self-pay

## 2019-12-14 ENCOUNTER — Ambulatory Visit
Admission: RE | Admit: 2019-12-14 | Discharge: 2019-12-14 | Disposition: A | Discharge status: Home / Self Care | Payer: Medicare Other | Source: Ambulatory Visit | Attending: Family Medicine | Admitting: Family Medicine

## 2019-12-14 ENCOUNTER — Other Ambulatory Visit: Payer: Self-pay | Admitting: Family Medicine

## 2019-12-14 DIAGNOSIS — E119 Type 2 diabetes mellitus without complications: Secondary | ICD-10-CM | POA: Diagnosis not present

## 2019-12-14 DIAGNOSIS — I10 Essential (primary) hypertension: Secondary | ICD-10-CM | POA: Diagnosis not present

## 2019-12-14 DIAGNOSIS — Z7984 Long term (current) use of oral hypoglycemic drugs: Secondary | ICD-10-CM | POA: Diagnosis not present

## 2019-12-14 DIAGNOSIS — R519 Headache, unspecified: Secondary | ICD-10-CM

## 2019-12-14 DIAGNOSIS — R42 Dizziness and giddiness: Secondary | ICD-10-CM | POA: Diagnosis not present

## 2019-12-14 DIAGNOSIS — E7800 Pure hypercholesterolemia, unspecified: Secondary | ICD-10-CM | POA: Diagnosis not present

## 2019-12-15 DIAGNOSIS — Z961 Presence of intraocular lens: Secondary | ICD-10-CM | POA: Diagnosis not present

## 2019-12-15 DIAGNOSIS — H401132 Primary open-angle glaucoma, bilateral, moderate stage: Secondary | ICD-10-CM | POA: Diagnosis not present

## 2019-12-31 DIAGNOSIS — E119 Type 2 diabetes mellitus without complications: Secondary | ICD-10-CM | POA: Diagnosis not present

## 2019-12-31 DIAGNOSIS — I10 Essential (primary) hypertension: Secondary | ICD-10-CM | POA: Diagnosis not present

## 2019-12-31 DIAGNOSIS — E7800 Pure hypercholesterolemia, unspecified: Secondary | ICD-10-CM | POA: Diagnosis not present

## 2020-01-05 DIAGNOSIS — Z0000 Encounter for general adult medical examination without abnormal findings: Secondary | ICD-10-CM | POA: Diagnosis not present

## 2020-01-05 DIAGNOSIS — Z23 Encounter for immunization: Secondary | ICD-10-CM | POA: Diagnosis not present

## 2020-01-05 DIAGNOSIS — Z1389 Encounter for screening for other disorder: Secondary | ICD-10-CM | POA: Diagnosis not present

## 2020-01-18 ENCOUNTER — Other Ambulatory Visit: Payer: Self-pay | Admitting: Family Medicine

## 2020-01-18 DIAGNOSIS — E2839 Other primary ovarian failure: Secondary | ICD-10-CM

## 2020-02-04 DIAGNOSIS — E7800 Pure hypercholesterolemia, unspecified: Secondary | ICD-10-CM | POA: Diagnosis not present

## 2020-02-04 DIAGNOSIS — K219 Gastro-esophageal reflux disease without esophagitis: Secondary | ICD-10-CM | POA: Diagnosis not present

## 2020-02-04 DIAGNOSIS — E119 Type 2 diabetes mellitus without complications: Secondary | ICD-10-CM | POA: Diagnosis not present

## 2020-02-04 DIAGNOSIS — I10 Essential (primary) hypertension: Secondary | ICD-10-CM | POA: Diagnosis not present

## 2020-02-16 DIAGNOSIS — Z23 Encounter for immunization: Secondary | ICD-10-CM | POA: Diagnosis not present

## 2020-04-21 DIAGNOSIS — H04123 Dry eye syndrome of bilateral lacrimal glands: Secondary | ICD-10-CM | POA: Diagnosis not present

## 2020-04-21 DIAGNOSIS — H401132 Primary open-angle glaucoma, bilateral, moderate stage: Secondary | ICD-10-CM | POA: Diagnosis not present

## 2020-04-26 DIAGNOSIS — E785 Hyperlipidemia, unspecified: Secondary | ICD-10-CM | POA: Diagnosis not present

## 2020-04-26 DIAGNOSIS — K219 Gastro-esophageal reflux disease without esophagitis: Secondary | ICD-10-CM | POA: Diagnosis not present

## 2020-04-26 DIAGNOSIS — I10 Essential (primary) hypertension: Secondary | ICD-10-CM | POA: Diagnosis not present

## 2020-04-26 DIAGNOSIS — E7800 Pure hypercholesterolemia, unspecified: Secondary | ICD-10-CM | POA: Diagnosis not present

## 2020-04-26 DIAGNOSIS — E119 Type 2 diabetes mellitus without complications: Secondary | ICD-10-CM | POA: Diagnosis not present

## 2020-04-28 ENCOUNTER — Other Ambulatory Visit: Payer: Self-pay

## 2020-04-28 ENCOUNTER — Ambulatory Visit
Admission: RE | Admit: 2020-04-28 | Discharge: 2020-04-28 | Disposition: A | Discharge status: Home / Self Care | Payer: Medicare Other | Source: Ambulatory Visit | Attending: Family Medicine | Admitting: Family Medicine

## 2020-04-28 DIAGNOSIS — Z780 Asymptomatic menopausal state: Secondary | ICD-10-CM | POA: Diagnosis not present

## 2020-04-28 DIAGNOSIS — E2839 Other primary ovarian failure: Secondary | ICD-10-CM

## 2020-05-04 ENCOUNTER — Other Ambulatory Visit: Payer: Self-pay | Admitting: Family Medicine

## 2020-05-04 DIAGNOSIS — Z1231 Encounter for screening mammogram for malignant neoplasm of breast: Secondary | ICD-10-CM

## 2020-06-06 DIAGNOSIS — E785 Hyperlipidemia, unspecified: Secondary | ICD-10-CM | POA: Diagnosis not present

## 2020-06-06 DIAGNOSIS — E119 Type 2 diabetes mellitus without complications: Secondary | ICD-10-CM | POA: Diagnosis not present

## 2020-06-06 DIAGNOSIS — I10 Essential (primary) hypertension: Secondary | ICD-10-CM | POA: Diagnosis not present

## 2020-06-06 DIAGNOSIS — K219 Gastro-esophageal reflux disease without esophagitis: Secondary | ICD-10-CM | POA: Diagnosis not present

## 2020-06-06 DIAGNOSIS — E7800 Pure hypercholesterolemia, unspecified: Secondary | ICD-10-CM | POA: Diagnosis not present

## 2020-06-27 ENCOUNTER — Ambulatory Visit: Payer: Medicare Other

## 2020-06-29 ENCOUNTER — Other Ambulatory Visit: Payer: Self-pay

## 2020-06-29 ENCOUNTER — Ambulatory Visit
Admission: RE | Admit: 2020-06-29 | Discharge: 2020-06-29 | Disposition: A | Discharge status: Home / Self Care | Payer: Medicare Other | Source: Ambulatory Visit | Attending: Family Medicine | Admitting: Family Medicine

## 2020-06-29 DIAGNOSIS — Z1231 Encounter for screening mammogram for malignant neoplasm of breast: Secondary | ICD-10-CM

## 2020-08-11 ENCOUNTER — Ambulatory Visit: Payer: Medicare Other

## 2020-08-19 DIAGNOSIS — H401132 Primary open-angle glaucoma, bilateral, moderate stage: Secondary | ICD-10-CM | POA: Diagnosis not present

## 2020-08-22 DIAGNOSIS — E119 Type 2 diabetes mellitus without complications: Secondary | ICD-10-CM | POA: Diagnosis not present

## 2020-08-22 DIAGNOSIS — K219 Gastro-esophageal reflux disease without esophagitis: Secondary | ICD-10-CM | POA: Diagnosis not present

## 2020-08-22 DIAGNOSIS — E1169 Type 2 diabetes mellitus with other specified complication: Secondary | ICD-10-CM | POA: Diagnosis not present

## 2020-08-22 DIAGNOSIS — Z23 Encounter for immunization: Secondary | ICD-10-CM | POA: Diagnosis not present

## 2020-08-22 DIAGNOSIS — I10 Essential (primary) hypertension: Secondary | ICD-10-CM | POA: Diagnosis not present

## 2020-08-22 DIAGNOSIS — E7800 Pure hypercholesterolemia, unspecified: Secondary | ICD-10-CM | POA: Diagnosis not present

## 2020-09-23 DIAGNOSIS — I10 Essential (primary) hypertension: Secondary | ICD-10-CM | POA: Diagnosis not present

## 2020-09-23 DIAGNOSIS — K219 Gastro-esophageal reflux disease without esophagitis: Secondary | ICD-10-CM | POA: Diagnosis not present

## 2020-09-23 DIAGNOSIS — E785 Hyperlipidemia, unspecified: Secondary | ICD-10-CM | POA: Diagnosis not present

## 2020-09-23 DIAGNOSIS — E7800 Pure hypercholesterolemia, unspecified: Secondary | ICD-10-CM | POA: Diagnosis not present

## 2020-09-23 DIAGNOSIS — E119 Type 2 diabetes mellitus without complications: Secondary | ICD-10-CM | POA: Diagnosis not present

## 2020-09-23 DIAGNOSIS — E1169 Type 2 diabetes mellitus with other specified complication: Secondary | ICD-10-CM | POA: Diagnosis not present

## 2020-11-01 DIAGNOSIS — E119 Type 2 diabetes mellitus without complications: Secondary | ICD-10-CM | POA: Diagnosis not present

## 2020-11-01 DIAGNOSIS — K219 Gastro-esophageal reflux disease without esophagitis: Secondary | ICD-10-CM | POA: Diagnosis not present

## 2020-11-01 DIAGNOSIS — E7800 Pure hypercholesterolemia, unspecified: Secondary | ICD-10-CM | POA: Diagnosis not present

## 2020-11-01 DIAGNOSIS — I10 Essential (primary) hypertension: Secondary | ICD-10-CM | POA: Diagnosis not present

## 2020-11-01 DIAGNOSIS — E1169 Type 2 diabetes mellitus with other specified complication: Secondary | ICD-10-CM | POA: Diagnosis not present

## 2020-11-01 DIAGNOSIS — E785 Hyperlipidemia, unspecified: Secondary | ICD-10-CM | POA: Diagnosis not present

## 2020-11-04 DIAGNOSIS — E785 Hyperlipidemia, unspecified: Secondary | ICD-10-CM | POA: Diagnosis not present

## 2020-11-04 DIAGNOSIS — E119 Type 2 diabetes mellitus without complications: Secondary | ICD-10-CM | POA: Diagnosis not present

## 2020-11-04 DIAGNOSIS — I10 Essential (primary) hypertension: Secondary | ICD-10-CM | POA: Diagnosis not present

## 2020-11-04 DIAGNOSIS — E7800 Pure hypercholesterolemia, unspecified: Secondary | ICD-10-CM | POA: Diagnosis not present

## 2020-11-04 DIAGNOSIS — E1169 Type 2 diabetes mellitus with other specified complication: Secondary | ICD-10-CM | POA: Diagnosis not present

## 2020-11-04 DIAGNOSIS — K219 Gastro-esophageal reflux disease without esophagitis: Secondary | ICD-10-CM | POA: Diagnosis not present

## 2020-11-18 DIAGNOSIS — H401132 Primary open-angle glaucoma, bilateral, moderate stage: Secondary | ICD-10-CM | POA: Diagnosis not present

## 2020-12-31 DIAGNOSIS — Z23 Encounter for immunization: Secondary | ICD-10-CM | POA: Diagnosis not present

## 2021-01-05 DIAGNOSIS — Z0000 Encounter for general adult medical examination without abnormal findings: Secondary | ICD-10-CM | POA: Diagnosis not present

## 2021-01-05 DIAGNOSIS — Z1389 Encounter for screening for other disorder: Secondary | ICD-10-CM | POA: Diagnosis not present

## 2021-01-31 DIAGNOSIS — E1169 Type 2 diabetes mellitus with other specified complication: Secondary | ICD-10-CM | POA: Diagnosis not present

## 2021-01-31 DIAGNOSIS — E785 Hyperlipidemia, unspecified: Secondary | ICD-10-CM | POA: Diagnosis not present

## 2021-01-31 DIAGNOSIS — E7800 Pure hypercholesterolemia, unspecified: Secondary | ICD-10-CM | POA: Diagnosis not present

## 2021-01-31 DIAGNOSIS — I10 Essential (primary) hypertension: Secondary | ICD-10-CM | POA: Diagnosis not present

## 2021-01-31 DIAGNOSIS — E119 Type 2 diabetes mellitus without complications: Secondary | ICD-10-CM | POA: Diagnosis not present

## 2021-01-31 DIAGNOSIS — K219 Gastro-esophageal reflux disease without esophagitis: Secondary | ICD-10-CM | POA: Diagnosis not present

## 2021-03-09 DIAGNOSIS — E785 Hyperlipidemia, unspecified: Secondary | ICD-10-CM | POA: Diagnosis not present

## 2021-03-09 DIAGNOSIS — E1169 Type 2 diabetes mellitus with other specified complication: Secondary | ICD-10-CM | POA: Diagnosis not present

## 2021-03-09 DIAGNOSIS — K219 Gastro-esophageal reflux disease without esophagitis: Secondary | ICD-10-CM | POA: Diagnosis not present

## 2021-03-09 DIAGNOSIS — I10 Essential (primary) hypertension: Secondary | ICD-10-CM | POA: Diagnosis not present

## 2021-03-23 DIAGNOSIS — H524 Presbyopia: Secondary | ICD-10-CM | POA: Diagnosis not present

## 2021-03-23 DIAGNOSIS — Z961 Presence of intraocular lens: Secondary | ICD-10-CM | POA: Diagnosis not present

## 2021-03-23 DIAGNOSIS — E119 Type 2 diabetes mellitus without complications: Secondary | ICD-10-CM | POA: Diagnosis not present

## 2021-03-23 DIAGNOSIS — H401132 Primary open-angle glaucoma, bilateral, moderate stage: Secondary | ICD-10-CM | POA: Diagnosis not present

## 2021-04-28 DIAGNOSIS — K219 Gastro-esophageal reflux disease without esophagitis: Secondary | ICD-10-CM | POA: Diagnosis not present

## 2021-04-28 DIAGNOSIS — I10 Essential (primary) hypertension: Secondary | ICD-10-CM | POA: Diagnosis not present

## 2021-04-28 DIAGNOSIS — E1169 Type 2 diabetes mellitus with other specified complication: Secondary | ICD-10-CM | POA: Diagnosis not present

## 2021-05-25 ENCOUNTER — Other Ambulatory Visit: Payer: Self-pay | Admitting: Family Medicine

## 2021-05-25 DIAGNOSIS — Z1231 Encounter for screening mammogram for malignant neoplasm of breast: Secondary | ICD-10-CM

## 2021-06-05 IMAGING — CT CT HEAD W/O CM
1 series · 16 of 30 positions shown, 20 images · non-contrast
Comparison: [id number]

CLINICAL DATA: Fall, hit forehead

EXAM:
CT HEAD WITHOUT CONTRAST
TECHNIQUE: Contiguous axial images were obtained from the base of the skull
through the vertex without intravenous contrast.

[Series 2: head w/(date) · axial · 0.49mm/px · z∈[-184,-44]mm · 16 of 32 slices shown, 20 images]
[im 2/32  brain]
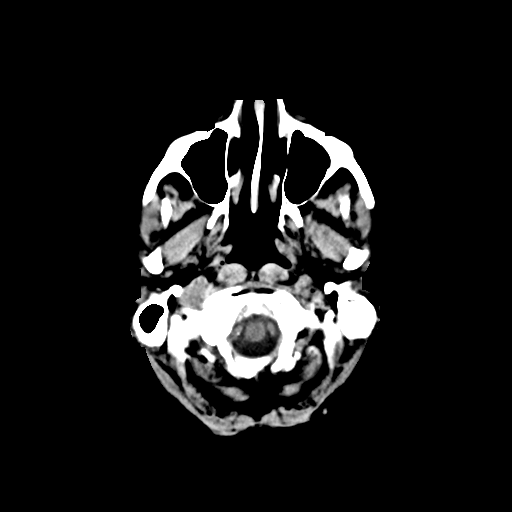
[im 2/32  bone]
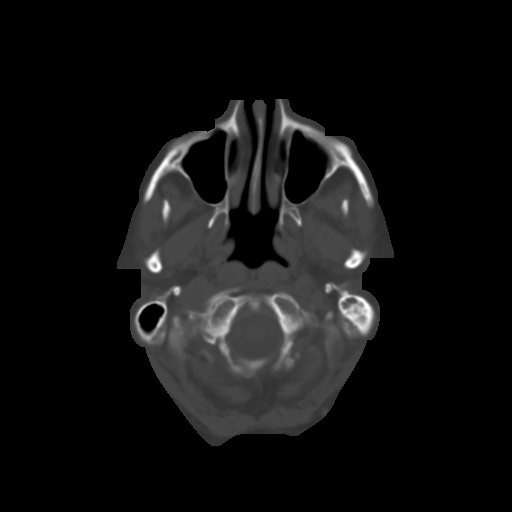
[im 4/32  brain]
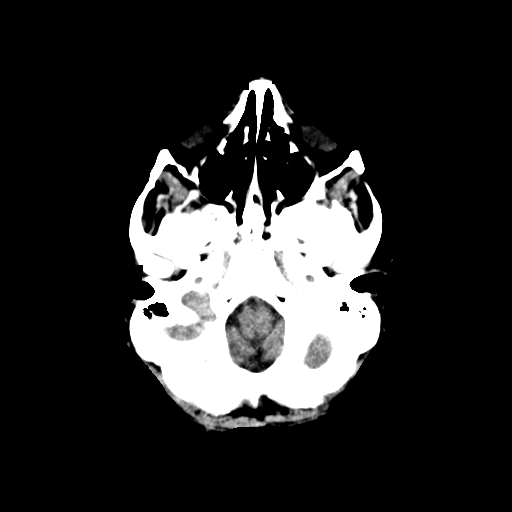
[im 6/32  brain]
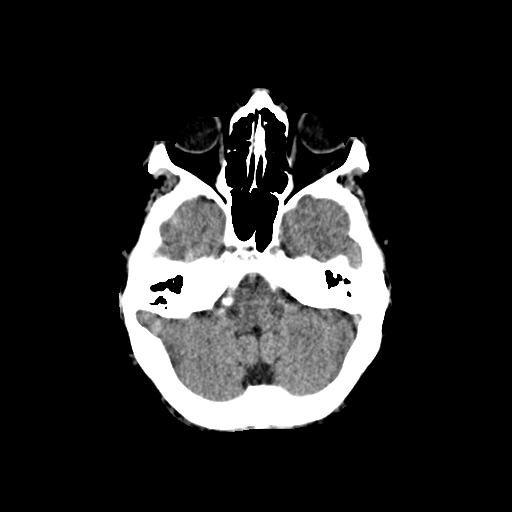
[im 8/32  brain]
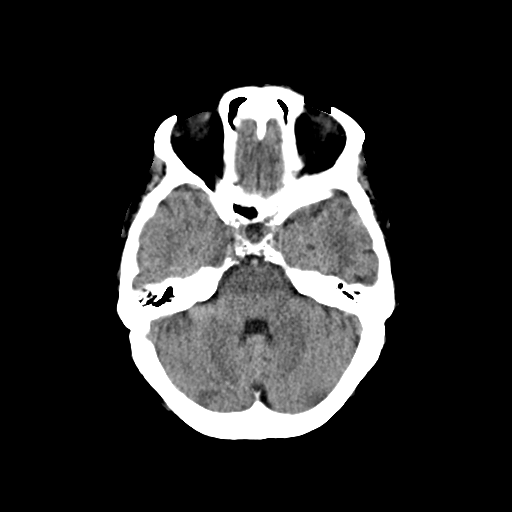
[im 9/32  brain]
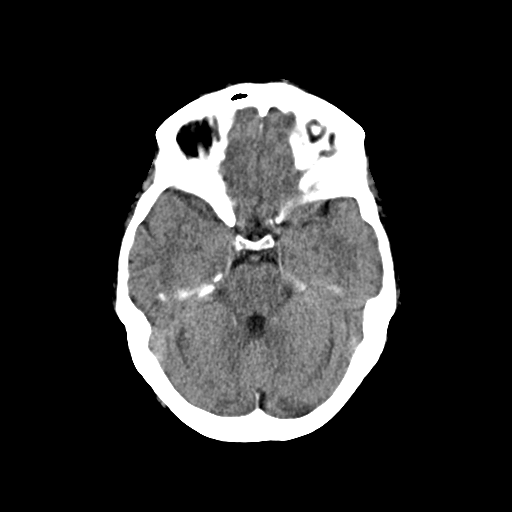
[im 9/32  bone]
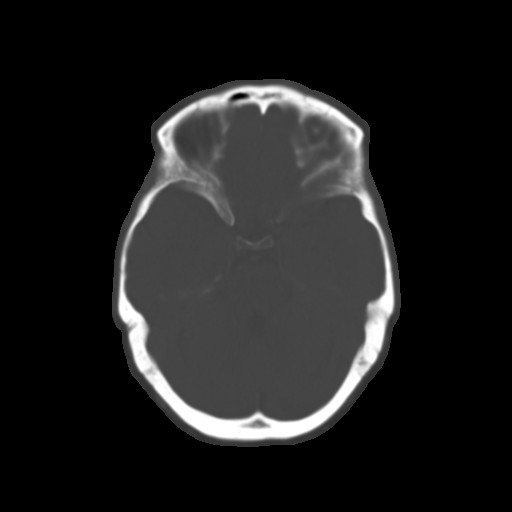
[im 11/32  brain]
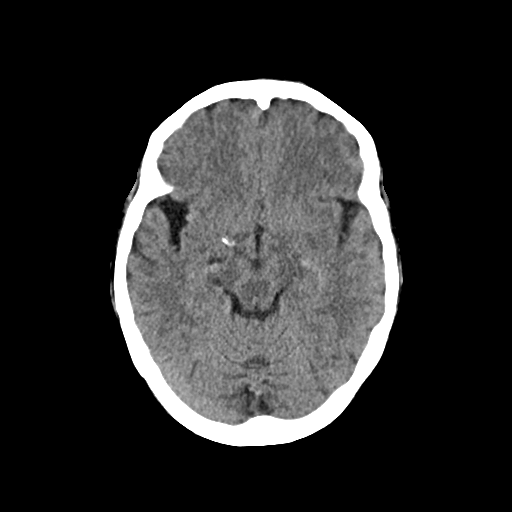
[im 13/32  brain]
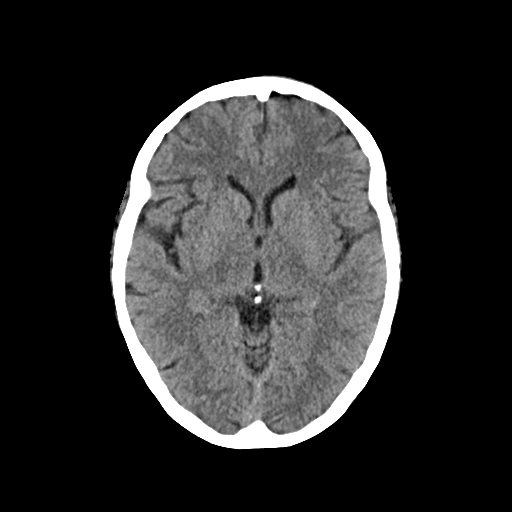
[im 15/32  brain]
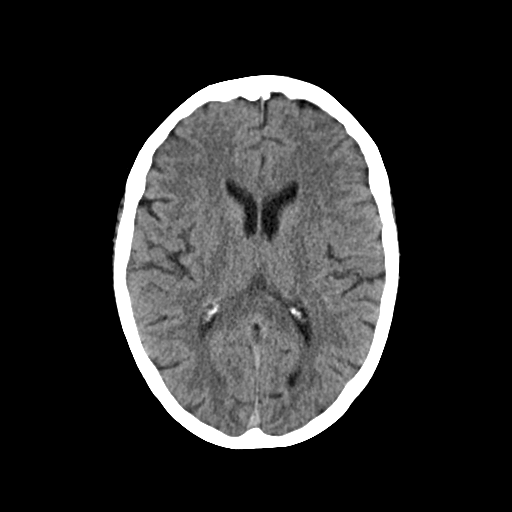
[im 17/32  brain]
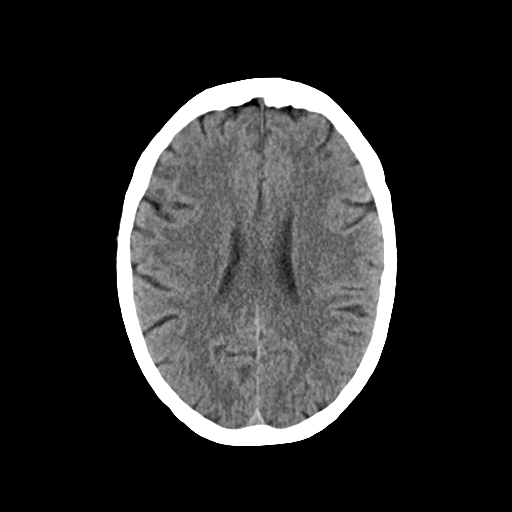
[im 17/32  bone]
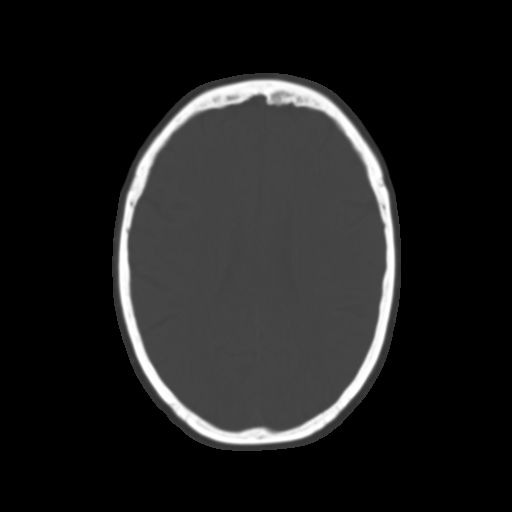
[im 19/32  brain]
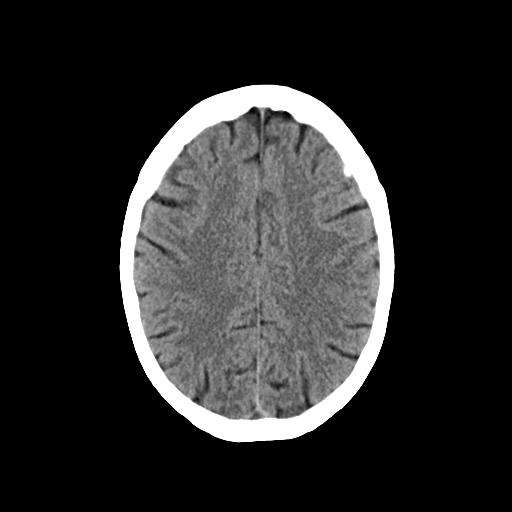
[im 21/32  brain]
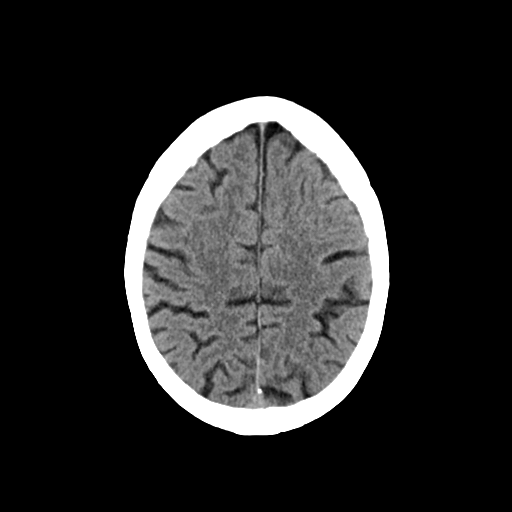
[im 23/32  brain]
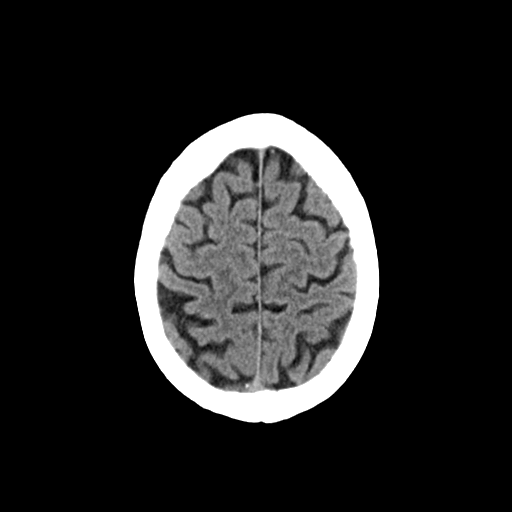
[im 24/32  brain]
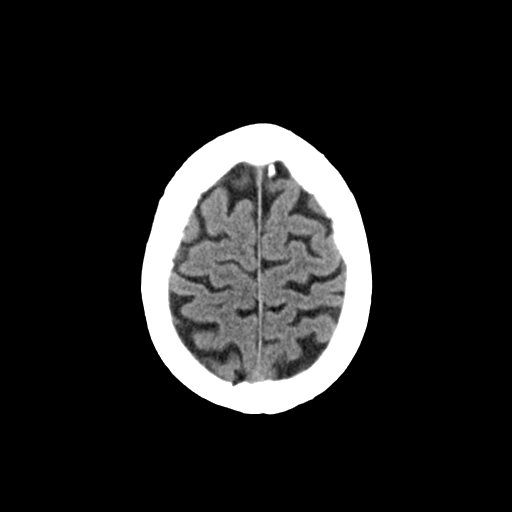
[im 24/32  bone]
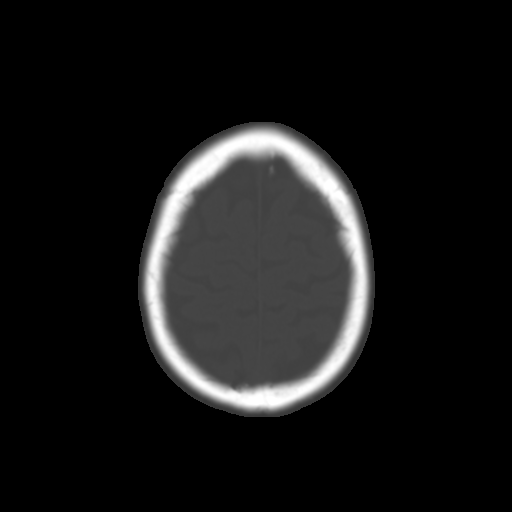
[im 26/32  brain]
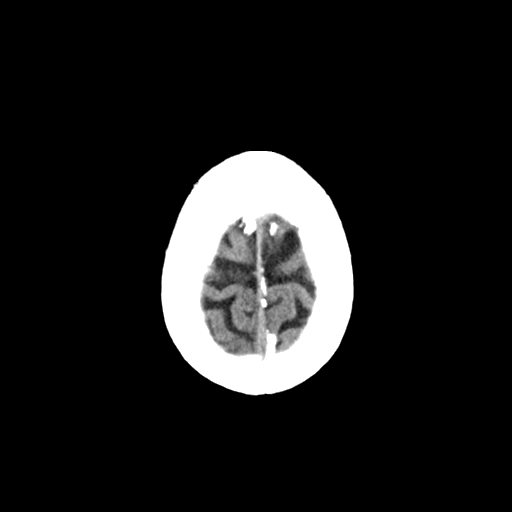
[im 28/32  brain]
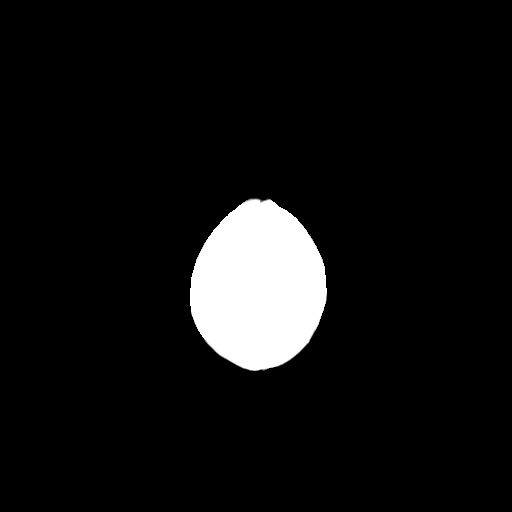
[im 30/32  brain]
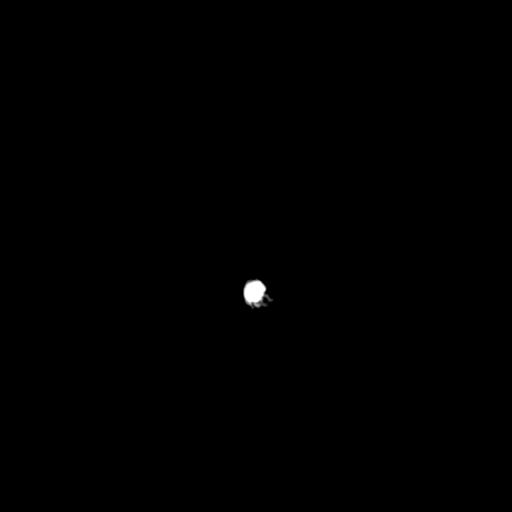

[16 of 30 positions shown; findings below may reference images not displayed]

FINDINGS: Brain: There is no acute intracranial hemorrhage, mass effect, or
edema. Gray-white differentiation is preserved. There is no
extra-axial fluid collection. Ventricles and sulci are within normal
limits in size and configuration.

Vascular: No hyperdense vessel or unexpected calcification.

Skull: Calvarium is unremarkable.

Sinuses/Orbits: No acute finding.

Other: None.
IMPRESSION: No evidence of acute intracranial injury.

## 2021-07-05 ENCOUNTER — Ambulatory Visit
Admission: RE | Admit: 2021-07-05 | Discharge: 2021-07-05 | Disposition: A | Discharge status: Home / Self Care | Payer: Medicare Other | Source: Ambulatory Visit | Attending: Family Medicine | Admitting: Family Medicine

## 2021-07-05 DIAGNOSIS — Z1231 Encounter for screening mammogram for malignant neoplasm of breast: Secondary | ICD-10-CM

## 2021-07-28 DIAGNOSIS — H401132 Primary open-angle glaucoma, bilateral, moderate stage: Secondary | ICD-10-CM | POA: Diagnosis not present

## 2021-09-13 DIAGNOSIS — E785 Hyperlipidemia, unspecified: Secondary | ICD-10-CM | POA: Diagnosis not present

## 2021-09-13 DIAGNOSIS — H401132 Primary open-angle glaucoma, bilateral, moderate stage: Secondary | ICD-10-CM | POA: Diagnosis not present

## 2021-09-13 DIAGNOSIS — Z23 Encounter for immunization: Secondary | ICD-10-CM | POA: Diagnosis not present

## 2021-09-13 DIAGNOSIS — K219 Gastro-esophageal reflux disease without esophagitis: Secondary | ICD-10-CM | POA: Diagnosis not present

## 2021-09-13 DIAGNOSIS — I10 Essential (primary) hypertension: Secondary | ICD-10-CM | POA: Diagnosis not present

## 2021-09-13 DIAGNOSIS — E1169 Type 2 diabetes mellitus with other specified complication: Secondary | ICD-10-CM | POA: Diagnosis not present

## 2021-12-01 DIAGNOSIS — H401132 Primary open-angle glaucoma, bilateral, moderate stage: Secondary | ICD-10-CM | POA: Diagnosis not present

## 2021-12-08 DIAGNOSIS — Z23 Encounter for immunization: Secondary | ICD-10-CM | POA: Diagnosis not present

## 2022-01-08 DIAGNOSIS — Z6830 Body mass index (BMI) 30.0-30.9, adult: Secondary | ICD-10-CM | POA: Diagnosis not present

## 2022-01-08 DIAGNOSIS — Z0000 Encounter for general adult medical examination without abnormal findings: Secondary | ICD-10-CM | POA: Diagnosis not present

## 2022-01-08 DIAGNOSIS — Z1389 Encounter for screening for other disorder: Secondary | ICD-10-CM | POA: Diagnosis not present

## 2022-01-08 DIAGNOSIS — Z23 Encounter for immunization: Secondary | ICD-10-CM | POA: Diagnosis not present

## 2022-01-22 DIAGNOSIS — Z6830 Body mass index (BMI) 30.0-30.9, adult: Secondary | ICD-10-CM | POA: Diagnosis not present

## 2022-01-22 DIAGNOSIS — E1169 Type 2 diabetes mellitus with other specified complication: Secondary | ICD-10-CM | POA: Diagnosis not present

## 2022-03-09 DIAGNOSIS — Z23 Encounter for immunization: Secondary | ICD-10-CM | POA: Diagnosis not present

## 2022-03-14 DIAGNOSIS — E785 Hyperlipidemia, unspecified: Secondary | ICD-10-CM | POA: Diagnosis not present

## 2022-03-14 DIAGNOSIS — K219 Gastro-esophageal reflux disease without esophagitis: Secondary | ICD-10-CM | POA: Diagnosis not present

## 2022-03-14 DIAGNOSIS — I10 Essential (primary) hypertension: Secondary | ICD-10-CM | POA: Diagnosis not present

## 2022-03-14 DIAGNOSIS — H401132 Primary open-angle glaucoma, bilateral, moderate stage: Secondary | ICD-10-CM | POA: Diagnosis not present

## 2022-03-14 DIAGNOSIS — E1169 Type 2 diabetes mellitus with other specified complication: Secondary | ICD-10-CM | POA: Diagnosis not present

## 2022-03-14 DIAGNOSIS — Z6830 Body mass index (BMI) 30.0-30.9, adult: Secondary | ICD-10-CM | POA: Diagnosis not present

## 2022-04-06 DIAGNOSIS — H401131 Primary open-angle glaucoma, bilateral, mild stage: Secondary | ICD-10-CM | POA: Diagnosis not present

## 2022-06-05 ENCOUNTER — Other Ambulatory Visit: Payer: Self-pay | Admitting: Family Medicine

## 2022-06-05 DIAGNOSIS — Z0000 Encounter for general adult medical examination without abnormal findings: Secondary | ICD-10-CM

## 2022-07-09 ENCOUNTER — Ambulatory Visit
Admission: RE | Admit: 2022-07-09 | Discharge: 2022-07-09 | Disposition: A | Discharge status: Home / Self Care | Payer: Medicare Other | Source: Ambulatory Visit | Attending: Family Medicine | Admitting: Family Medicine

## 2022-07-09 DIAGNOSIS — Z0000 Encounter for general adult medical examination without abnormal findings: Secondary | ICD-10-CM

## 2022-07-09 DIAGNOSIS — Z1231 Encounter for screening mammogram for malignant neoplasm of breast: Secondary | ICD-10-CM | POA: Diagnosis not present

## 2022-07-10 DIAGNOSIS — H401122 Primary open-angle glaucoma, left eye, moderate stage: Secondary | ICD-10-CM | POA: Diagnosis not present

## 2022-07-10 DIAGNOSIS — H524 Presbyopia: Secondary | ICD-10-CM | POA: Diagnosis not present

## 2022-08-17 DIAGNOSIS — H401131 Primary open-angle glaucoma, bilateral, mild stage: Secondary | ICD-10-CM | POA: Diagnosis not present

## 2022-09-22 DIAGNOSIS — J0140 Acute pansinusitis, unspecified: Secondary | ICD-10-CM | POA: Diagnosis not present

## 2022-10-05 DIAGNOSIS — Z6830 Body mass index (BMI) 30.0-30.9, adult: Secondary | ICD-10-CM | POA: Diagnosis not present

## 2022-10-05 DIAGNOSIS — K219 Gastro-esophageal reflux disease without esophagitis: Secondary | ICD-10-CM | POA: Diagnosis not present

## 2022-10-05 DIAGNOSIS — E785 Hyperlipidemia, unspecified: Secondary | ICD-10-CM | POA: Diagnosis not present

## 2022-10-05 DIAGNOSIS — H401132 Primary open-angle glaucoma, bilateral, moderate stage: Secondary | ICD-10-CM | POA: Diagnosis not present

## 2022-10-05 DIAGNOSIS — E1169 Type 2 diabetes mellitus with other specified complication: Secondary | ICD-10-CM | POA: Diagnosis not present

## 2022-10-05 DIAGNOSIS — I10 Essential (primary) hypertension: Secondary | ICD-10-CM | POA: Diagnosis not present

## 2022-10-18 DIAGNOSIS — M25521 Pain in right elbow: Secondary | ICD-10-CM | POA: Diagnosis not present

## 2022-11-16 DIAGNOSIS — H401131 Primary open-angle glaucoma, bilateral, mild stage: Secondary | ICD-10-CM | POA: Diagnosis not present

## 2022-11-29 DIAGNOSIS — Z23 Encounter for immunization: Secondary | ICD-10-CM | POA: Diagnosis not present

## 2023-01-10 DIAGNOSIS — Z1389 Encounter for screening for other disorder: Secondary | ICD-10-CM | POA: Diagnosis not present

## 2023-01-10 DIAGNOSIS — Z6831 Body mass index (BMI) 31.0-31.9, adult: Secondary | ICD-10-CM | POA: Diagnosis not present

## 2023-01-10 DIAGNOSIS — Z0000 Encounter for general adult medical examination without abnormal findings: Secondary | ICD-10-CM | POA: Diagnosis not present

## 2023-01-11 ENCOUNTER — Other Ambulatory Visit: Payer: Self-pay | Admitting: Family Medicine

## 2023-01-11 DIAGNOSIS — E2839 Other primary ovarian failure: Secondary | ICD-10-CM

## 2023-02-08 DIAGNOSIS — H401131 Primary open-angle glaucoma, bilateral, mild stage: Secondary | ICD-10-CM | POA: Diagnosis not present

## 2023-02-08 DIAGNOSIS — H04123 Dry eye syndrome of bilateral lacrimal glands: Secondary | ICD-10-CM | POA: Diagnosis not present

## 2023-04-08 DIAGNOSIS — K219 Gastro-esophageal reflux disease without esophagitis: Secondary | ICD-10-CM | POA: Diagnosis not present

## 2023-04-08 DIAGNOSIS — E785 Hyperlipidemia, unspecified: Secondary | ICD-10-CM | POA: Diagnosis not present

## 2023-04-08 DIAGNOSIS — H401132 Primary open-angle glaucoma, bilateral, moderate stage: Secondary | ICD-10-CM | POA: Diagnosis not present

## 2023-04-08 DIAGNOSIS — I10 Essential (primary) hypertension: Secondary | ICD-10-CM | POA: Diagnosis not present

## 2023-04-08 DIAGNOSIS — E118 Type 2 diabetes mellitus with unspecified complications: Secondary | ICD-10-CM | POA: Diagnosis not present

## 2023-04-08 DIAGNOSIS — Z6831 Body mass index (BMI) 31.0-31.9, adult: Secondary | ICD-10-CM | POA: Diagnosis not present

## 2023-04-22 ENCOUNTER — Other Ambulatory Visit: Payer: Self-pay | Admitting: Nurse Practitioner

## 2023-04-22 DIAGNOSIS — R1310 Dysphagia, unspecified: Secondary | ICD-10-CM | POA: Diagnosis not present

## 2023-04-22 DIAGNOSIS — K219 Gastro-esophageal reflux disease without esophagitis: Secondary | ICD-10-CM | POA: Diagnosis not present

## 2023-04-22 DIAGNOSIS — K5900 Constipation, unspecified: Secondary | ICD-10-CM | POA: Diagnosis not present

## 2023-05-09 ENCOUNTER — Ambulatory Visit
Admission: RE | Admit: 2023-05-09 | Discharge: 2023-05-09 | Disposition: A | Discharge status: Home / Self Care | Payer: Medicare Other | Source: Ambulatory Visit | Attending: Nurse Practitioner | Admitting: Nurse Practitioner

## 2023-05-09 DIAGNOSIS — K219 Gastro-esophageal reflux disease without esophagitis: Secondary | ICD-10-CM | POA: Diagnosis not present

## 2023-05-09 DIAGNOSIS — K449 Diaphragmatic hernia without obstruction or gangrene: Secondary | ICD-10-CM | POA: Diagnosis not present

## 2023-05-09 DIAGNOSIS — R1310 Dysphagia, unspecified: Secondary | ICD-10-CM

## 2023-05-29 ENCOUNTER — Other Ambulatory Visit: Payer: Self-pay | Admitting: Family Medicine

## 2023-05-29 DIAGNOSIS — Z1231 Encounter for screening mammogram for malignant neoplasm of breast: Secondary | ICD-10-CM

## 2023-06-14 DIAGNOSIS — H524 Presbyopia: Secondary | ICD-10-CM | POA: Diagnosis not present

## 2023-06-14 DIAGNOSIS — H401132 Primary open-angle glaucoma, bilateral, moderate stage: Secondary | ICD-10-CM | POA: Diagnosis not present

## 2023-07-03 DIAGNOSIS — E785 Hyperlipidemia, unspecified: Secondary | ICD-10-CM | POA: Diagnosis not present

## 2023-07-03 DIAGNOSIS — I10 Essential (primary) hypertension: Secondary | ICD-10-CM | POA: Diagnosis not present

## 2023-07-03 DIAGNOSIS — E1169 Type 2 diabetes mellitus with other specified complication: Secondary | ICD-10-CM | POA: Diagnosis not present

## 2023-07-03 DIAGNOSIS — E118 Type 2 diabetes mellitus with unspecified complications: Secondary | ICD-10-CM | POA: Diagnosis not present

## 2023-07-10 ENCOUNTER — Ambulatory Visit
Admission: RE | Admit: 2023-07-10 | Discharge: 2023-07-10 | Disposition: A | Discharge status: Home / Self Care | Source: Ambulatory Visit | Attending: Family Medicine | Admitting: Family Medicine

## 2023-07-10 DIAGNOSIS — Z1231 Encounter for screening mammogram for malignant neoplasm of breast: Secondary | ICD-10-CM | POA: Diagnosis not present

## 2023-07-24 DIAGNOSIS — Z1211 Encounter for screening for malignant neoplasm of colon: Secondary | ICD-10-CM | POA: Diagnosis not present

## 2023-07-24 DIAGNOSIS — R1310 Dysphagia, unspecified: Secondary | ICD-10-CM | POA: Diagnosis not present

## 2023-07-24 DIAGNOSIS — K219 Gastro-esophageal reflux disease without esophagitis: Secondary | ICD-10-CM | POA: Diagnosis not present

## 2023-08-01 DIAGNOSIS — E1169 Type 2 diabetes mellitus with other specified complication: Secondary | ICD-10-CM | POA: Diagnosis not present

## 2023-08-01 DIAGNOSIS — I10 Essential (primary) hypertension: Secondary | ICD-10-CM | POA: Diagnosis not present

## 2023-08-01 DIAGNOSIS — E118 Type 2 diabetes mellitus with unspecified complications: Secondary | ICD-10-CM | POA: Diagnosis not present

## 2023-08-03 DIAGNOSIS — E785 Hyperlipidemia, unspecified: Secondary | ICD-10-CM | POA: Diagnosis not present

## 2023-08-03 DIAGNOSIS — E1169 Type 2 diabetes mellitus with other specified complication: Secondary | ICD-10-CM | POA: Diagnosis not present

## 2023-08-03 DIAGNOSIS — I10 Essential (primary) hypertension: Secondary | ICD-10-CM | POA: Diagnosis not present

## 2023-08-03 DIAGNOSIS — E118 Type 2 diabetes mellitus with unspecified complications: Secondary | ICD-10-CM | POA: Diagnosis not present

## 2023-08-28 ENCOUNTER — Other Ambulatory Visit: Payer: Medicare Other

## 2023-08-31 DIAGNOSIS — E118 Type 2 diabetes mellitus with unspecified complications: Secondary | ICD-10-CM | POA: Diagnosis not present

## 2023-08-31 DIAGNOSIS — I10 Essential (primary) hypertension: Secondary | ICD-10-CM | POA: Diagnosis not present

## 2023-08-31 DIAGNOSIS — E1169 Type 2 diabetes mellitus with other specified complication: Secondary | ICD-10-CM | POA: Diagnosis not present

## 2023-09-02 DIAGNOSIS — E1169 Type 2 diabetes mellitus with other specified complication: Secondary | ICD-10-CM | POA: Diagnosis not present

## 2023-09-02 DIAGNOSIS — E785 Hyperlipidemia, unspecified: Secondary | ICD-10-CM | POA: Diagnosis not present

## 2023-09-02 DIAGNOSIS — I10 Essential (primary) hypertension: Secondary | ICD-10-CM | POA: Diagnosis not present

## 2023-09-02 DIAGNOSIS — E118 Type 2 diabetes mellitus with unspecified complications: Secondary | ICD-10-CM | POA: Diagnosis not present

## 2023-09-11 ENCOUNTER — Ambulatory Visit (HOSPITAL_BASED_OUTPATIENT_CLINIC_OR_DEPARTMENT_OTHER)
Admission: RE | Admit: 2023-09-11 | Discharge: 2023-09-11 | Disposition: A | Discharge status: Home / Self Care | Source: Ambulatory Visit | Attending: Family Medicine | Admitting: Family Medicine

## 2023-09-11 DIAGNOSIS — E2839 Other primary ovarian failure: Secondary | ICD-10-CM | POA: Diagnosis not present

## 2023-09-11 DIAGNOSIS — Z780 Asymptomatic menopausal state: Secondary | ICD-10-CM | POA: Diagnosis not present

## 2023-09-16 DIAGNOSIS — Z961 Presence of intraocular lens: Secondary | ICD-10-CM | POA: Diagnosis not present

## 2023-09-16 DIAGNOSIS — H401132 Primary open-angle glaucoma, bilateral, moderate stage: Secondary | ICD-10-CM | POA: Diagnosis not present

## 2023-09-30 DIAGNOSIS — I10 Essential (primary) hypertension: Secondary | ICD-10-CM | POA: Diagnosis not present

## 2023-09-30 DIAGNOSIS — E1169 Type 2 diabetes mellitus with other specified complication: Secondary | ICD-10-CM | POA: Diagnosis not present

## 2023-09-30 DIAGNOSIS — E118 Type 2 diabetes mellitus with unspecified complications: Secondary | ICD-10-CM | POA: Diagnosis not present

## 2023-10-03 DIAGNOSIS — I10 Essential (primary) hypertension: Secondary | ICD-10-CM | POA: Diagnosis not present

## 2023-10-03 DIAGNOSIS — E785 Hyperlipidemia, unspecified: Secondary | ICD-10-CM | POA: Diagnosis not present

## 2023-10-03 DIAGNOSIS — E1169 Type 2 diabetes mellitus with other specified complication: Secondary | ICD-10-CM | POA: Diagnosis not present

## 2023-10-03 DIAGNOSIS — E118 Type 2 diabetes mellitus with unspecified complications: Secondary | ICD-10-CM | POA: Diagnosis not present

## 2023-10-07 DIAGNOSIS — E118 Type 2 diabetes mellitus with unspecified complications: Secondary | ICD-10-CM | POA: Diagnosis not present

## 2023-10-07 DIAGNOSIS — K219 Gastro-esophageal reflux disease without esophagitis: Secondary | ICD-10-CM | POA: Diagnosis not present

## 2023-10-07 DIAGNOSIS — I10 Essential (primary) hypertension: Secondary | ICD-10-CM | POA: Diagnosis not present

## 2023-10-07 DIAGNOSIS — H401132 Primary open-angle glaucoma, bilateral, moderate stage: Secondary | ICD-10-CM | POA: Diagnosis not present

## 2023-10-07 DIAGNOSIS — E785 Hyperlipidemia, unspecified: Secondary | ICD-10-CM | POA: Diagnosis not present

## 2023-10-30 DIAGNOSIS — I10 Essential (primary) hypertension: Secondary | ICD-10-CM | POA: Diagnosis not present

## 2023-10-30 DIAGNOSIS — E118 Type 2 diabetes mellitus with unspecified complications: Secondary | ICD-10-CM | POA: Diagnosis not present

## 2023-10-30 DIAGNOSIS — E1169 Type 2 diabetes mellitus with other specified complication: Secondary | ICD-10-CM | POA: Diagnosis not present

## 2023-11-03 DIAGNOSIS — E785 Hyperlipidemia, unspecified: Secondary | ICD-10-CM | POA: Diagnosis not present

## 2023-11-03 DIAGNOSIS — I10 Essential (primary) hypertension: Secondary | ICD-10-CM | POA: Diagnosis not present

## 2023-11-03 DIAGNOSIS — E1169 Type 2 diabetes mellitus with other specified complication: Secondary | ICD-10-CM | POA: Diagnosis not present

## 2023-11-03 DIAGNOSIS — E118 Type 2 diabetes mellitus with unspecified complications: Secondary | ICD-10-CM | POA: Diagnosis not present

## 2023-11-29 DIAGNOSIS — E1169 Type 2 diabetes mellitus with other specified complication: Secondary | ICD-10-CM | POA: Diagnosis not present

## 2023-11-29 DIAGNOSIS — E118 Type 2 diabetes mellitus with unspecified complications: Secondary | ICD-10-CM | POA: Diagnosis not present

## 2023-11-29 DIAGNOSIS — I10 Essential (primary) hypertension: Secondary | ICD-10-CM | POA: Diagnosis not present

## 2023-12-03 DIAGNOSIS — E118 Type 2 diabetes mellitus with unspecified complications: Secondary | ICD-10-CM | POA: Diagnosis not present

## 2023-12-03 DIAGNOSIS — E785 Hyperlipidemia, unspecified: Secondary | ICD-10-CM | POA: Diagnosis not present

## 2023-12-03 DIAGNOSIS — I10 Essential (primary) hypertension: Secondary | ICD-10-CM | POA: Diagnosis not present

## 2023-12-03 DIAGNOSIS — E1169 Type 2 diabetes mellitus with other specified complication: Secondary | ICD-10-CM | POA: Diagnosis not present

## 2023-12-10 DIAGNOSIS — Z23 Encounter for immunization: Secondary | ICD-10-CM | POA: Diagnosis not present

## 2023-12-17 DIAGNOSIS — H401132 Primary open-angle glaucoma, bilateral, moderate stage: Secondary | ICD-10-CM | POA: Diagnosis not present

## 2023-12-29 DIAGNOSIS — E1169 Type 2 diabetes mellitus with other specified complication: Secondary | ICD-10-CM | POA: Diagnosis not present

## 2023-12-29 DIAGNOSIS — I10 Essential (primary) hypertension: Secondary | ICD-10-CM | POA: Diagnosis not present

## 2023-12-29 DIAGNOSIS — E118 Type 2 diabetes mellitus with unspecified complications: Secondary | ICD-10-CM | POA: Diagnosis not present

## 2024-01-03 DIAGNOSIS — E1169 Type 2 diabetes mellitus with other specified complication: Secondary | ICD-10-CM | POA: Diagnosis not present

## 2024-01-03 DIAGNOSIS — I10 Essential (primary) hypertension: Secondary | ICD-10-CM | POA: Diagnosis not present

## 2024-01-03 DIAGNOSIS — E118 Type 2 diabetes mellitus with unspecified complications: Secondary | ICD-10-CM | POA: Diagnosis not present

## 2024-01-03 DIAGNOSIS — E785 Hyperlipidemia, unspecified: Secondary | ICD-10-CM | POA: Diagnosis not present

## 2024-01-13 DIAGNOSIS — H401111 Primary open-angle glaucoma, right eye, mild stage: Secondary | ICD-10-CM | POA: Diagnosis not present

## 2024-01-14 DIAGNOSIS — Z1331 Encounter for screening for depression: Secondary | ICD-10-CM | POA: Diagnosis not present

## 2024-01-14 DIAGNOSIS — Z0000 Encounter for general adult medical examination without abnormal findings: Secondary | ICD-10-CM | POA: Diagnosis not present

## 2024-01-28 DIAGNOSIS — E1169 Type 2 diabetes mellitus with other specified complication: Secondary | ICD-10-CM | POA: Diagnosis not present

## 2024-01-28 DIAGNOSIS — I10 Essential (primary) hypertension: Secondary | ICD-10-CM | POA: Diagnosis not present

## 2024-01-28 DIAGNOSIS — E118 Type 2 diabetes mellitus with unspecified complications: Secondary | ICD-10-CM | POA: Diagnosis not present

## 2024-02-02 DIAGNOSIS — E118 Type 2 diabetes mellitus with unspecified complications: Secondary | ICD-10-CM | POA: Diagnosis not present

## 2024-02-02 DIAGNOSIS — E1169 Type 2 diabetes mellitus with other specified complication: Secondary | ICD-10-CM | POA: Diagnosis not present

## 2024-02-02 DIAGNOSIS — E785 Hyperlipidemia, unspecified: Secondary | ICD-10-CM | POA: Diagnosis not present

## 2024-02-02 DIAGNOSIS — I10 Essential (primary) hypertension: Secondary | ICD-10-CM | POA: Diagnosis not present
